# Patient Record
Sex: Male | Born: 1960 | Race: White | Hispanic: No | Marital: Married | Smoking: Former smoker
Health system: Southern US, Community
[De-identification: ages and names within clinical notes are randomized; demographics above are authoritative.]

## PROBLEM LIST (undated history)

## (undated) DIAGNOSIS — K219 Gastro-esophageal reflux disease without esophagitis: Secondary | ICD-10-CM

## (undated) DIAGNOSIS — E785 Hyperlipidemia, unspecified: Secondary | ICD-10-CM

## (undated) DIAGNOSIS — Z8489 Family history of other specified conditions: Secondary | ICD-10-CM

## (undated) DIAGNOSIS — I10 Essential (primary) hypertension: Secondary | ICD-10-CM

## (undated) DIAGNOSIS — M1990 Unspecified osteoarthritis, unspecified site: Secondary | ICD-10-CM

## (undated) DIAGNOSIS — Z87442 Personal history of urinary calculi: Secondary | ICD-10-CM

## (undated) HISTORY — PX: REPLACEMENT TOTAL KNEE: SUR1224

---

## 2001-01-07 ENCOUNTER — Encounter (INDEPENDENT_AMBULATORY_CARE_PROVIDER_SITE_OTHER): Payer: Self-pay | Admitting: Specialist

## 2001-01-07 ENCOUNTER — Other Ambulatory Visit: Admission: RE | Admit: 2001-01-07 | Discharge: 2001-01-07 | Payer: Self-pay | Admitting: Urology

## 2006-01-10 ENCOUNTER — Encounter: Admission: RE | Admit: 2006-01-10 | Discharge: 2006-01-10 | Payer: Self-pay | Admitting: Family Medicine

## 2006-03-16 ENCOUNTER — Encounter: Admission: RE | Admit: 2006-03-16 | Discharge: 2006-03-16 | Payer: Self-pay | Admitting: Family Medicine

## 2008-05-08 ENCOUNTER — Emergency Department (HOSPITAL_COMMUNITY): Admission: EM | Admit: 2008-05-08 | Discharge: 2008-05-08 | Payer: Self-pay | Admitting: Family Medicine

## 2011-03-17 ENCOUNTER — Other Ambulatory Visit: Payer: Self-pay | Admitting: Family Medicine

## 2011-03-17 DIAGNOSIS — J3489 Other specified disorders of nose and nasal sinuses: Secondary | ICD-10-CM

## 2011-03-20 ENCOUNTER — Ambulatory Visit
Admission: RE | Admit: 2011-03-20 | Discharge: 2011-03-20 | Disposition: A | Discharge status: Home / Self Care | Payer: BC Managed Care – PPO | Source: Ambulatory Visit | Attending: Family Medicine | Admitting: Family Medicine

## 2011-03-20 DIAGNOSIS — J3489 Other specified disorders of nose and nasal sinuses: Secondary | ICD-10-CM

## 2013-06-25 IMAGING — CT CT PARANASAL SINUSES LIMITED
1 series · 11 of 13 positions shown, 14 images · non-contrast
Comparison: 03/16/2006

CLINICAL DATA: Sinus pressure and drainage.  Headaches.

CT LIMITED SINUSES WITHOUT CONTRAST
TECHNIQUE: Multidetector CT images of the paranasal sinuses were
obtained in a single plane without contrast.

[Series 3: cor soft · axial · 0.38mm/px · z∈[+4,+104]mm · 11 of 13 slices shown, 14 images]
[im 2/13  brain]
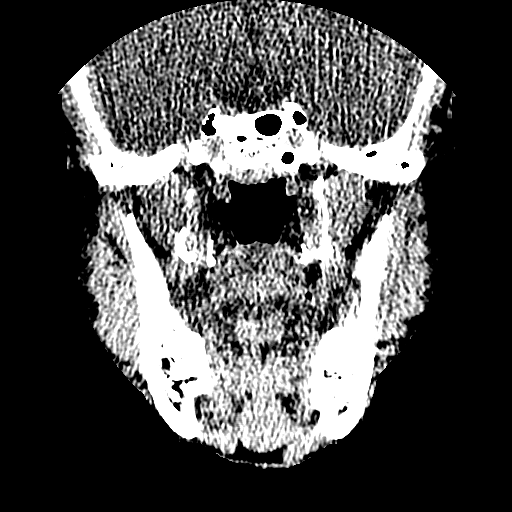
[im 2/13  bone]
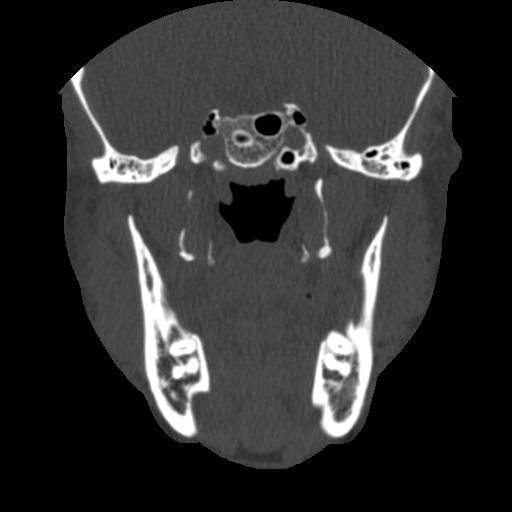
[im 3/13  bone]
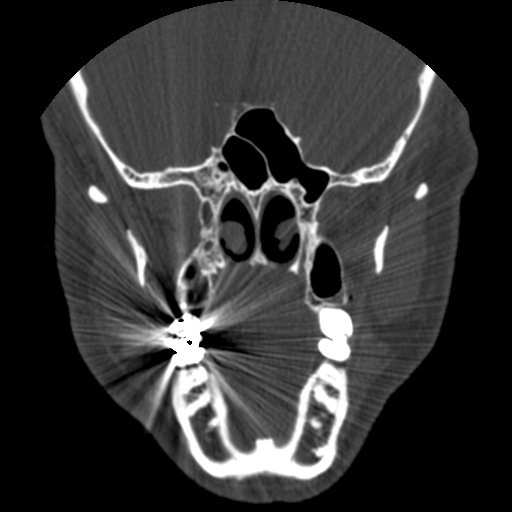
[im 4/13  bone]
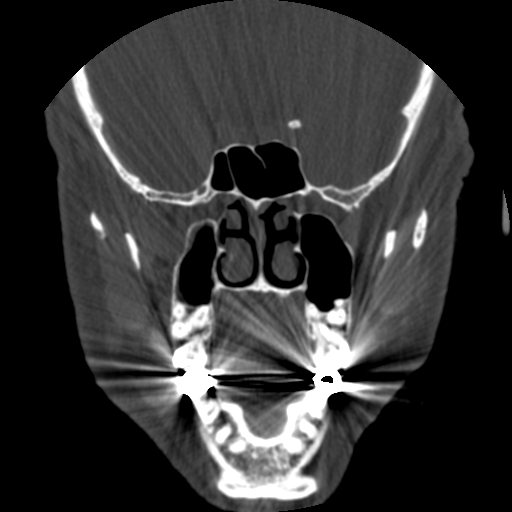
[im 5/13  bone]
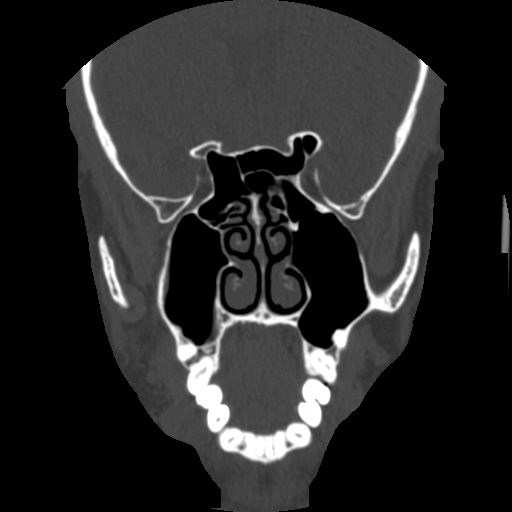
[im 6/13  brain]
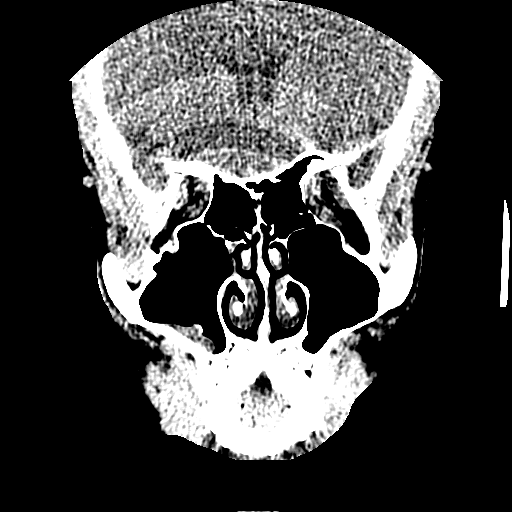
[im 6/13  bone]
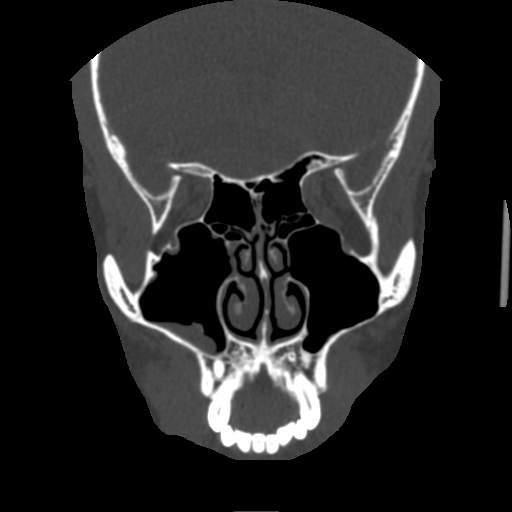
[im 7/13  bone]
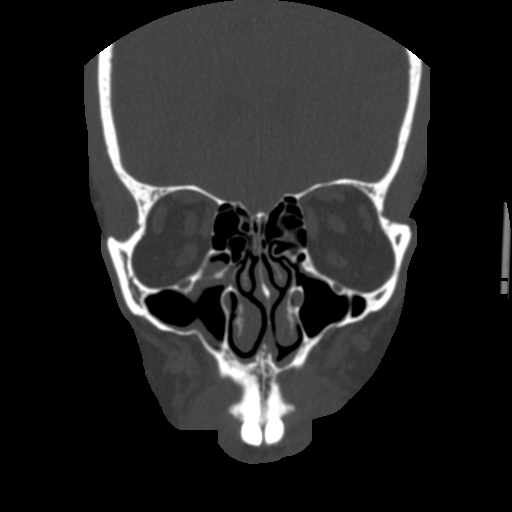
[im 8/13  bone]
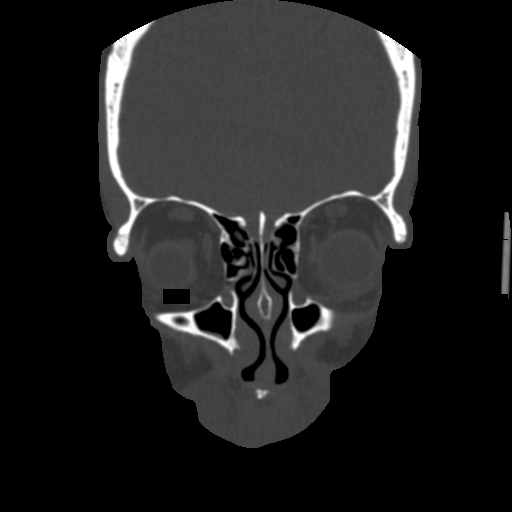
[im 9/13  bone]
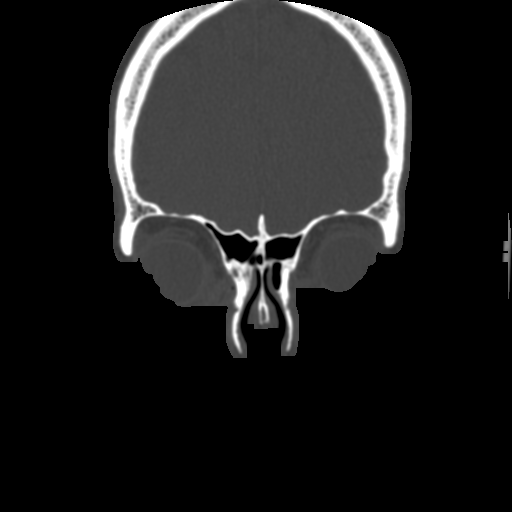
[im 10/13  brain]
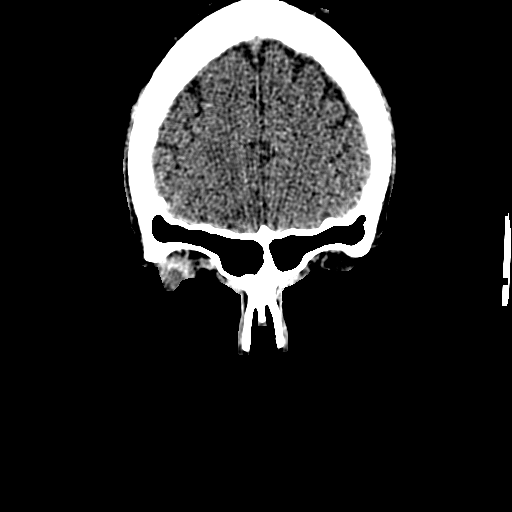
[im 10/13  bone]
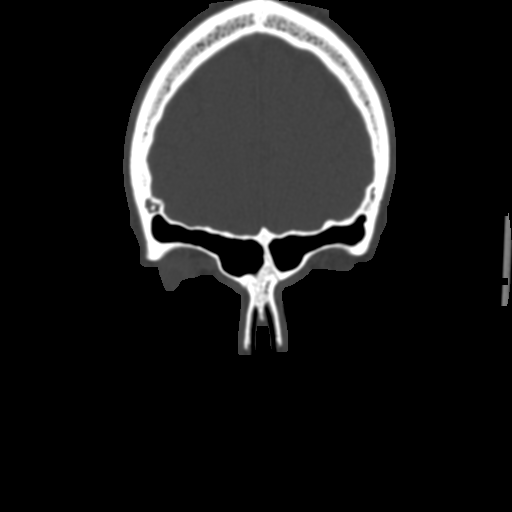
[im 11/13  bone]
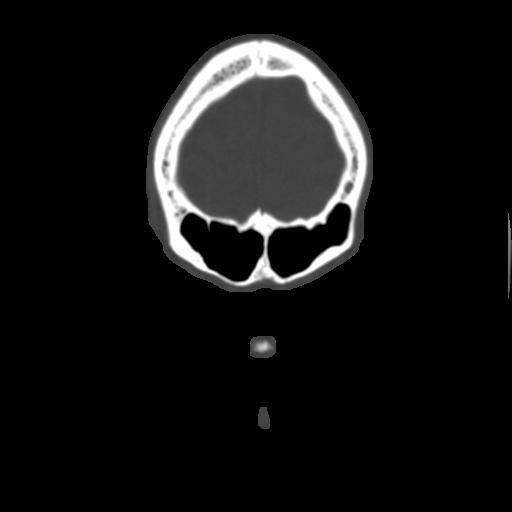
[im 12/13  bone]
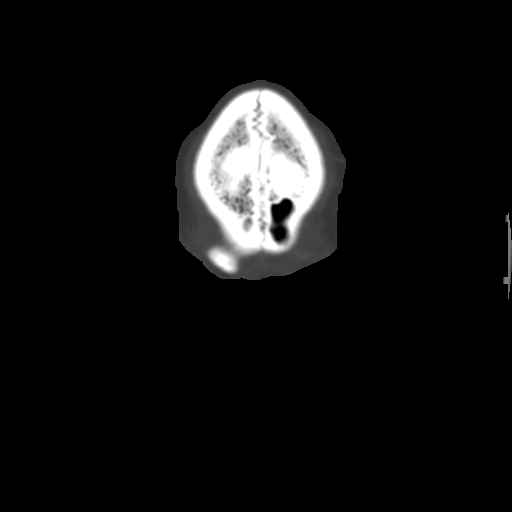

[11 of 13 positions shown; findings below may reference images not displayed]

FINDINGS: Frontal sinuses are clear.  Sphenoid sinuses are clear.
The right maxillary sinuses clear.  The left maxillary sinus shows
mild mucosal thickening but no fluid.  There are a few scattered
opacified ethmoid air cells.  Nasal septum is midline.
IMPRESSION: Scattered mucosal thickening affecting the left maxillary sinus.
No free fluid.  A few opacified ethmoid air cells.

## 2016-02-02 DIAGNOSIS — Z85828 Personal history of other malignant neoplasm of skin: Secondary | ICD-10-CM | POA: Diagnosis not present

## 2016-02-02 DIAGNOSIS — L281 Prurigo nodularis: Secondary | ICD-10-CM | POA: Diagnosis not present

## 2016-02-02 DIAGNOSIS — L565 Disseminated superficial actinic porokeratosis (DSAP): Secondary | ICD-10-CM | POA: Diagnosis not present

## 2016-02-02 DIAGNOSIS — D2262 Melanocytic nevi of left upper limb, including shoulder: Secondary | ICD-10-CM | POA: Diagnosis not present

## 2016-02-02 DIAGNOSIS — C44212 Basal cell carcinoma of skin of right ear and external auricular canal: Secondary | ICD-10-CM | POA: Diagnosis not present

## 2016-02-02 DIAGNOSIS — C44519 Basal cell carcinoma of skin of other part of trunk: Secondary | ICD-10-CM | POA: Diagnosis not present

## 2016-02-23 DIAGNOSIS — C44212 Basal cell carcinoma of skin of right ear and external auricular canal: Secondary | ICD-10-CM | POA: Diagnosis not present

## 2016-03-07 DIAGNOSIS — Z6830 Body mass index (BMI) 30.0-30.9, adult: Secondary | ICD-10-CM | POA: Diagnosis not present

## 2016-03-17 DIAGNOSIS — I10 Essential (primary) hypertension: Secondary | ICD-10-CM | POA: Diagnosis not present

## 2016-03-17 DIAGNOSIS — E782 Mixed hyperlipidemia: Secondary | ICD-10-CM | POA: Diagnosis not present

## 2016-03-17 DIAGNOSIS — E559 Vitamin D deficiency, unspecified: Secondary | ICD-10-CM | POA: Diagnosis not present

## 2016-03-17 DIAGNOSIS — R945 Abnormal results of liver function studies: Secondary | ICD-10-CM | POA: Diagnosis not present

## 2016-03-17 DIAGNOSIS — Z23 Encounter for immunization: Secondary | ICD-10-CM | POA: Diagnosis not present

## 2016-09-19 DIAGNOSIS — Z125 Encounter for screening for malignant neoplasm of prostate: Secondary | ICD-10-CM | POA: Diagnosis not present

## 2016-09-19 DIAGNOSIS — Z0000 Encounter for general adult medical examination without abnormal findings: Secondary | ICD-10-CM | POA: Diagnosis not present

## 2016-09-19 DIAGNOSIS — Z13220 Encounter for screening for lipoid disorders: Secondary | ICD-10-CM | POA: Diagnosis not present

## 2016-09-22 DIAGNOSIS — I10 Essential (primary) hypertension: Secondary | ICD-10-CM | POA: Diagnosis not present

## 2016-09-22 DIAGNOSIS — Z0000 Encounter for general adult medical examination without abnormal findings: Secondary | ICD-10-CM | POA: Diagnosis not present

## 2016-09-22 DIAGNOSIS — Z23 Encounter for immunization: Secondary | ICD-10-CM | POA: Diagnosis not present

## 2016-09-22 DIAGNOSIS — Z6833 Body mass index (BMI) 33.0-33.9, adult: Secondary | ICD-10-CM | POA: Diagnosis not present

## 2017-05-22 DIAGNOSIS — I10 Essential (primary) hypertension: Secondary | ICD-10-CM | POA: Diagnosis not present

## 2017-05-22 DIAGNOSIS — E559 Vitamin D deficiency, unspecified: Secondary | ICD-10-CM | POA: Diagnosis not present

## 2017-05-22 DIAGNOSIS — R945 Abnormal results of liver function studies: Secondary | ICD-10-CM | POA: Diagnosis not present

## 2017-05-25 ENCOUNTER — Other Ambulatory Visit: Payer: Self-pay | Admitting: Family Medicine

## 2017-05-25 DIAGNOSIS — Z23 Encounter for immunization: Secondary | ICD-10-CM | POA: Diagnosis not present

## 2017-05-25 DIAGNOSIS — I10 Essential (primary) hypertension: Secondary | ICD-10-CM | POA: Diagnosis not present

## 2017-05-25 DIAGNOSIS — R945 Abnormal results of liver function studies: Secondary | ICD-10-CM | POA: Diagnosis not present

## 2017-05-25 DIAGNOSIS — E782 Mixed hyperlipidemia: Secondary | ICD-10-CM | POA: Diagnosis not present

## 2017-05-25 DIAGNOSIS — R7989 Other specified abnormal findings of blood chemistry: Secondary | ICD-10-CM

## 2017-05-25 DIAGNOSIS — E559 Vitamin D deficiency, unspecified: Secondary | ICD-10-CM | POA: Diagnosis not present

## 2017-05-30 ENCOUNTER — Ambulatory Visit
Admission: RE | Admit: 2017-05-30 | Discharge: 2017-05-30 | Disposition: A | Discharge status: Home / Self Care | Payer: BLUE CROSS/BLUE SHIELD | Source: Ambulatory Visit | Attending: Family Medicine | Admitting: Family Medicine

## 2017-05-30 DIAGNOSIS — R7989 Other specified abnormal findings of blood chemistry: Secondary | ICD-10-CM

## 2017-05-30 DIAGNOSIS — R945 Abnormal results of liver function studies: Principal | ICD-10-CM

## 2017-06-20 DIAGNOSIS — R945 Abnormal results of liver function studies: Secondary | ICD-10-CM | POA: Diagnosis not present

## 2017-06-20 DIAGNOSIS — K8020 Calculus of gallbladder without cholecystitis without obstruction: Secondary | ICD-10-CM | POA: Diagnosis not present

## 2017-06-25 DIAGNOSIS — I10 Essential (primary) hypertension: Secondary | ICD-10-CM | POA: Diagnosis not present

## 2017-06-25 DIAGNOSIS — R945 Abnormal results of liver function studies: Secondary | ICD-10-CM | POA: Diagnosis not present

## 2017-06-25 DIAGNOSIS — N200 Calculus of kidney: Secondary | ICD-10-CM | POA: Diagnosis not present

## 2017-06-25 DIAGNOSIS — Z131 Encounter for screening for diabetes mellitus: Secondary | ICD-10-CM | POA: Diagnosis not present

## 2017-06-26 DIAGNOSIS — N200 Calculus of kidney: Secondary | ICD-10-CM | POA: Diagnosis not present

## 2017-09-25 DIAGNOSIS — Z13220 Encounter for screening for lipoid disorders: Secondary | ICD-10-CM | POA: Diagnosis not present

## 2017-09-25 DIAGNOSIS — Z114 Encounter for screening for human immunodeficiency virus [HIV]: Secondary | ICD-10-CM | POA: Diagnosis not present

## 2017-09-25 DIAGNOSIS — Z0000 Encounter for general adult medical examination without abnormal findings: Secondary | ICD-10-CM | POA: Diagnosis not present

## 2017-09-25 DIAGNOSIS — Z125 Encounter for screening for malignant neoplasm of prostate: Secondary | ICD-10-CM | POA: Diagnosis not present

## 2017-09-25 DIAGNOSIS — Z1329 Encounter for screening for other suspected endocrine disorder: Secondary | ICD-10-CM | POA: Diagnosis not present

## 2017-09-28 DIAGNOSIS — Z6833 Body mass index (BMI) 33.0-33.9, adult: Secondary | ICD-10-CM | POA: Diagnosis not present

## 2017-09-28 DIAGNOSIS — R945 Abnormal results of liver function studies: Secondary | ICD-10-CM | POA: Diagnosis not present

## 2017-09-28 DIAGNOSIS — E782 Mixed hyperlipidemia: Secondary | ICD-10-CM | POA: Diagnosis not present

## 2017-09-28 DIAGNOSIS — I10 Essential (primary) hypertension: Secondary | ICD-10-CM | POA: Diagnosis not present

## 2017-09-28 DIAGNOSIS — Z1211 Encounter for screening for malignant neoplasm of colon: Secondary | ICD-10-CM | POA: Diagnosis not present

## 2017-09-28 DIAGNOSIS — Z0000 Encounter for general adult medical examination without abnormal findings: Secondary | ICD-10-CM | POA: Diagnosis not present

## 2017-11-19 IMAGING — US US ABDOMEN COMPLETE
1 series · 13 of 25 positions shown · non-contrast
Comparison: 01/10/2006

CLINICAL DATA: Elevated LFTs

EXAM:
ABDOMEN ULTRASOUND COMPLETE

[Series 1: us abdomen complete · 0.23mm/px · 13 of 97 slices shown]
[im 1/97]
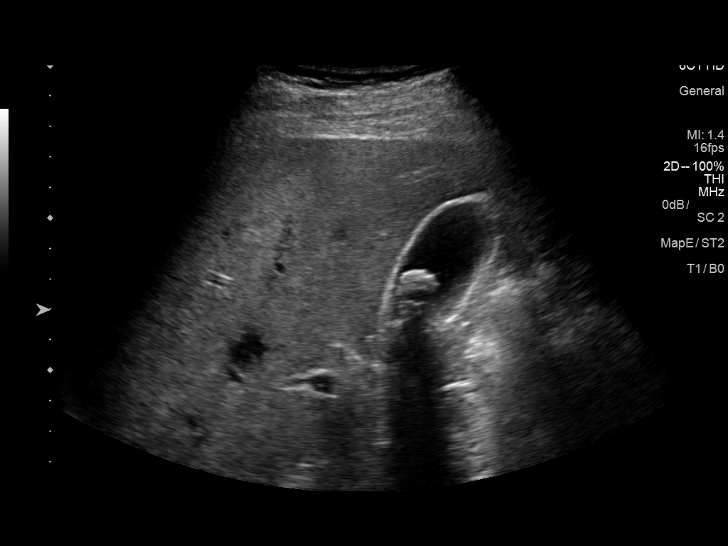
[im 9/97]
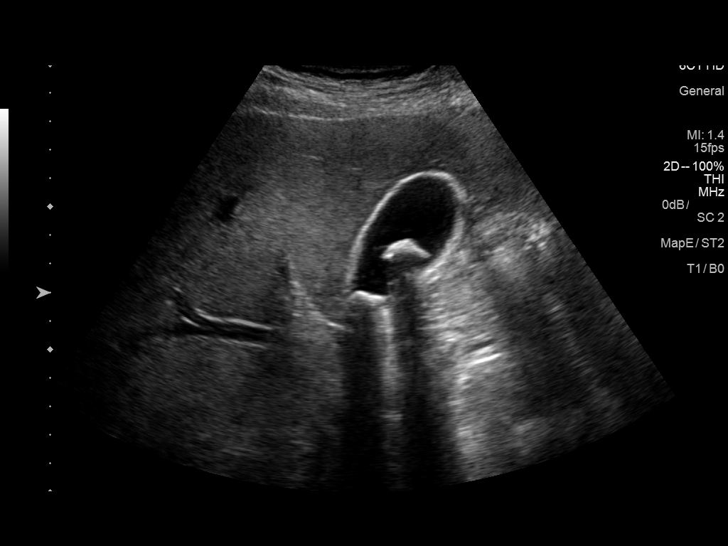
[im 17/97]
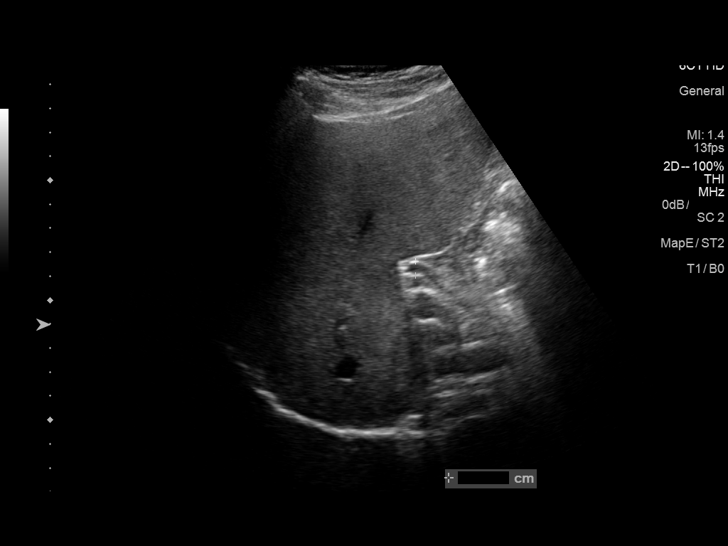
[im 25/97]
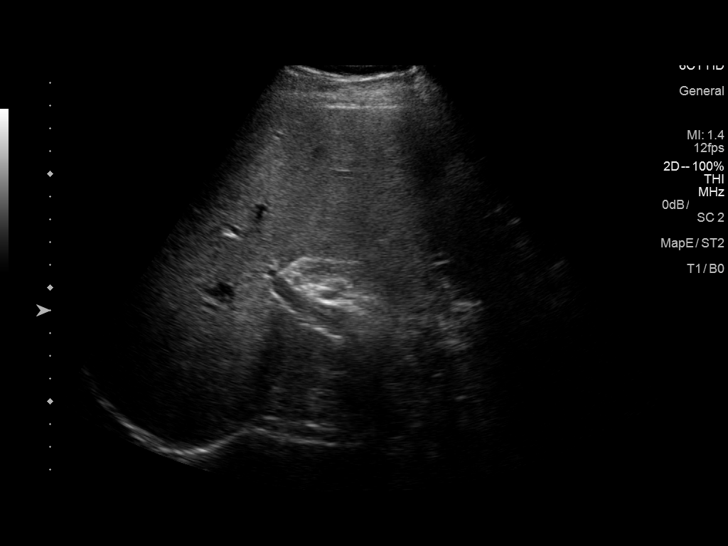
[im 33/97]
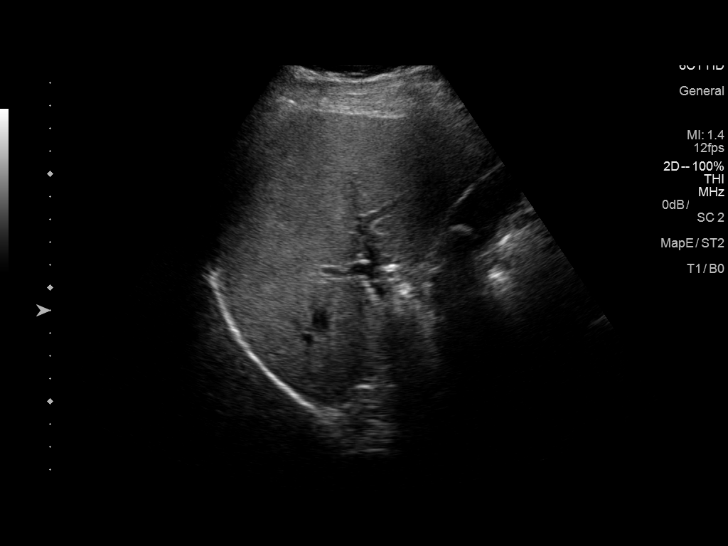
[im 41/97]
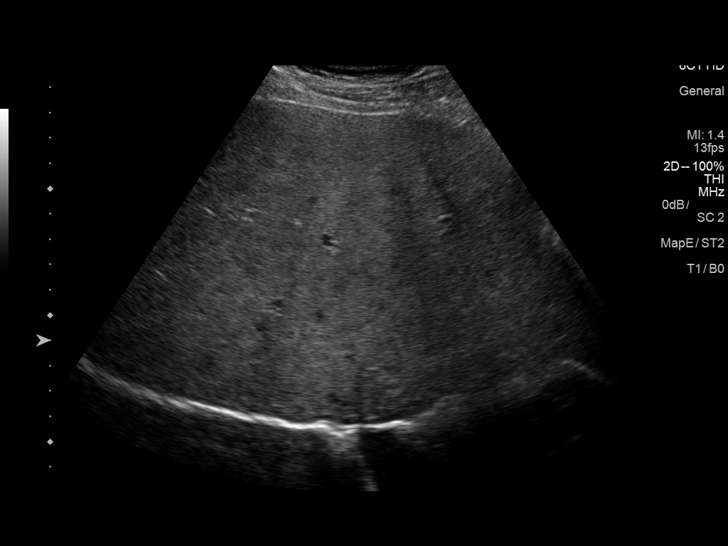
[im 49/97]
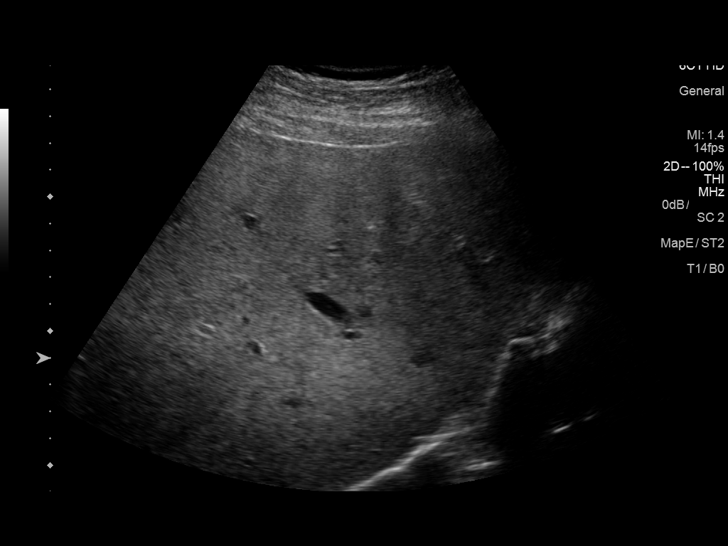
[im 57/97]
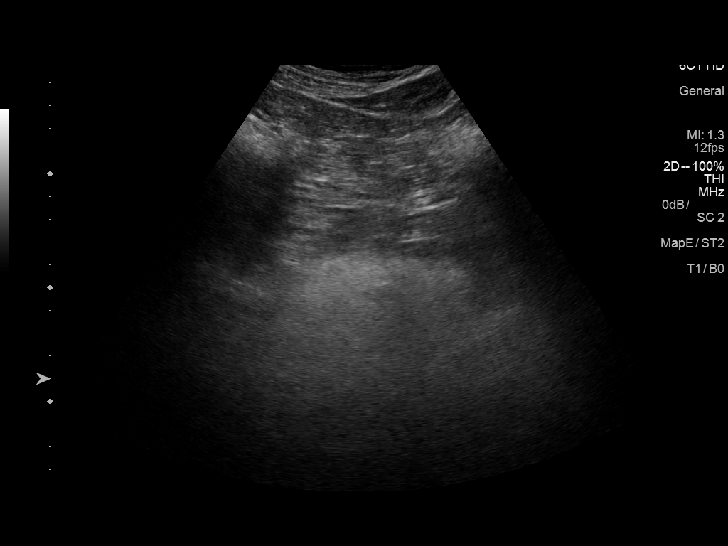
[im 65/97]
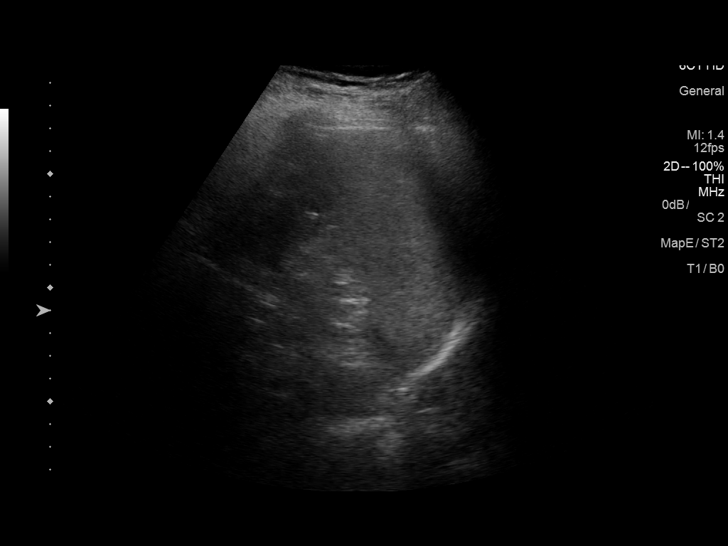
[im 73/97]
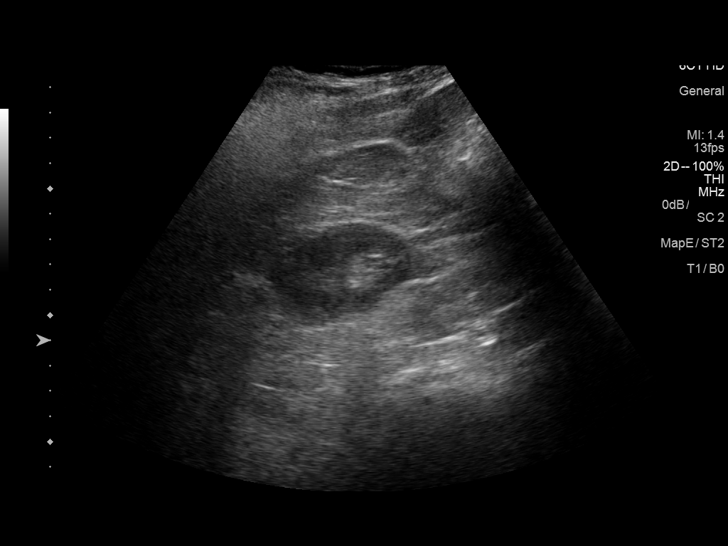
[im 81/97]
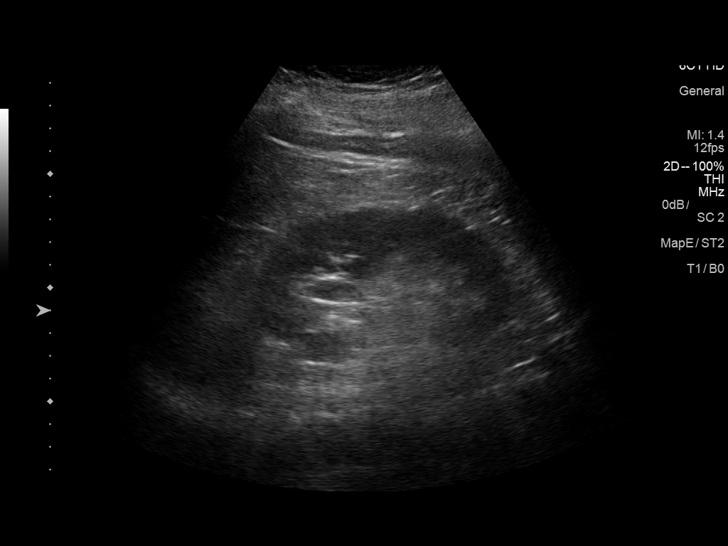
[im 89/97]
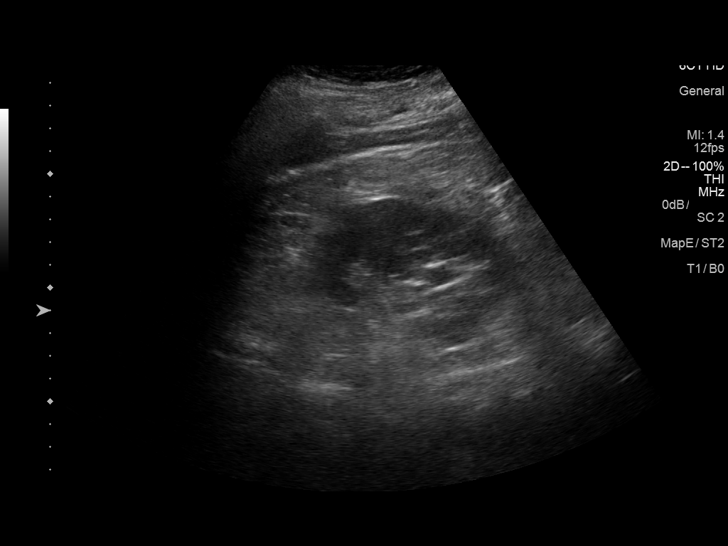
[im 97/97]
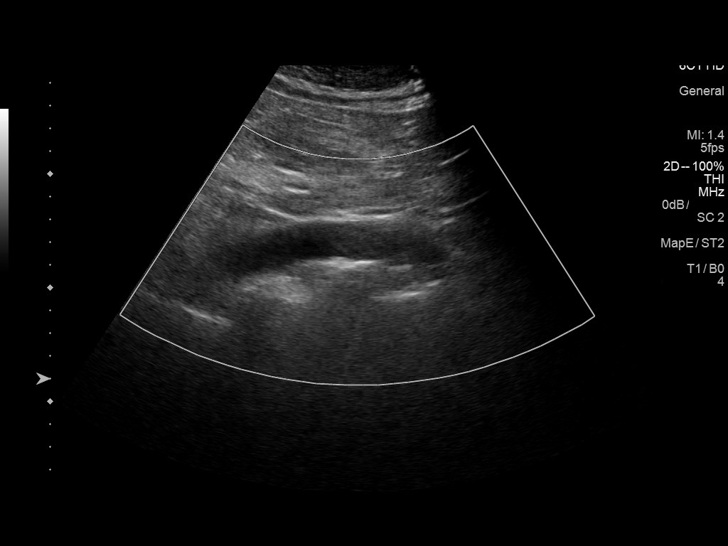

[13 of 25 positions shown; findings below may reference images not displayed]

FINDINGS: Gallbladder: Echogenic shadowing gallstones within the gallbladder.
Wall thickness measures 3.9 mm. No Murphy's sign. No pericholecystic
fluid. No signs of acute cholecystitis.

Common bile duct: Diameter: 6 mm

Liver: Mild heterogeneity, suspect slight degree of fatty
infiltration. No intrahepatic biliary dilatation. No focal
abnormality. Portal vein is patent on color Doppler imaging with
normal direction of blood flow towards the liver.

IVC: No abnormality visualized.

Pancreas: Visualized portion unremarkable.

Spleen: Size and appearance within normal limits.

Right Kidney: Length: 11.7 cm. Echogenicity within normal limits. No
mass or hydronephrosis visualized.

Left Kidney: Length: 11.4 cm. Normal echogenicity. No focal mass.
Suspect 7 mm echogenic calculus in the upper pole region with mild
associated upper pole caliectasis.

Abdominal aorta: No aneurysm visualized.

Other findings: No ascites or free fluid
IMPRESSION: Cholelithiasis without acute cholecystitis

No biliary obstruction

Suspect mild hepatic steatosis

7 mm left kidney intrarenal calculus with possible associated mild
upper pole caliectasis.

## 2017-11-30 DIAGNOSIS — Z6830 Body mass index (BMI) 30.0-30.9, adult: Secondary | ICD-10-CM | POA: Diagnosis not present

## 2017-11-30 DIAGNOSIS — J069 Acute upper respiratory infection, unspecified: Secondary | ICD-10-CM | POA: Diagnosis not present

## 2018-05-09 DIAGNOSIS — M1711 Unilateral primary osteoarthritis, right knee: Secondary | ICD-10-CM | POA: Diagnosis not present

## 2018-05-09 DIAGNOSIS — M25561 Pain in right knee: Secondary | ICD-10-CM | POA: Diagnosis not present

## 2018-07-24 DIAGNOSIS — E559 Vitamin D deficiency, unspecified: Secondary | ICD-10-CM | POA: Diagnosis not present

## 2018-07-24 DIAGNOSIS — E782 Mixed hyperlipidemia: Secondary | ICD-10-CM | POA: Diagnosis not present

## 2018-07-24 DIAGNOSIS — Z23 Encounter for immunization: Secondary | ICD-10-CM | POA: Diagnosis not present

## 2018-07-24 DIAGNOSIS — I10 Essential (primary) hypertension: Secondary | ICD-10-CM | POA: Diagnosis not present

## 2018-09-26 DIAGNOSIS — M25561 Pain in right knee: Secondary | ICD-10-CM | POA: Diagnosis not present

## 2018-09-30 DIAGNOSIS — Z13220 Encounter for screening for lipoid disorders: Secondary | ICD-10-CM | POA: Diagnosis not present

## 2018-09-30 DIAGNOSIS — E559 Vitamin D deficiency, unspecified: Secondary | ICD-10-CM | POA: Diagnosis not present

## 2018-09-30 DIAGNOSIS — Z125 Encounter for screening for malignant neoplasm of prostate: Secondary | ICD-10-CM | POA: Diagnosis not present

## 2018-09-30 DIAGNOSIS — Z0000 Encounter for general adult medical examination without abnormal findings: Secondary | ICD-10-CM | POA: Diagnosis not present

## 2018-10-04 DIAGNOSIS — Z0000 Encounter for general adult medical examination without abnormal findings: Secondary | ICD-10-CM | POA: Diagnosis not present

## 2018-10-04 DIAGNOSIS — Z6830 Body mass index (BMI) 30.0-30.9, adult: Secondary | ICD-10-CM | POA: Diagnosis not present

## 2018-10-04 DIAGNOSIS — E782 Mixed hyperlipidemia: Secondary | ICD-10-CM | POA: Diagnosis not present

## 2018-10-04 DIAGNOSIS — E559 Vitamin D deficiency, unspecified: Secondary | ICD-10-CM | POA: Diagnosis not present

## 2019-01-23 DIAGNOSIS — R945 Abnormal results of liver function studies: Secondary | ICD-10-CM | POA: Diagnosis not present

## 2019-01-23 DIAGNOSIS — I10 Essential (primary) hypertension: Secondary | ICD-10-CM | POA: Diagnosis not present

## 2019-01-23 DIAGNOSIS — E559 Vitamin D deficiency, unspecified: Secondary | ICD-10-CM | POA: Diagnosis not present

## 2019-01-23 DIAGNOSIS — E782 Mixed hyperlipidemia: Secondary | ICD-10-CM | POA: Diagnosis not present

## 2019-01-27 DIAGNOSIS — I10 Essential (primary) hypertension: Secondary | ICD-10-CM | POA: Diagnosis not present

## 2019-01-27 DIAGNOSIS — J309 Allergic rhinitis, unspecified: Secondary | ICD-10-CM | POA: Diagnosis not present

## 2019-01-27 DIAGNOSIS — E559 Vitamin D deficiency, unspecified: Secondary | ICD-10-CM | POA: Diagnosis not present

## 2019-01-27 DIAGNOSIS — E782 Mixed hyperlipidemia: Secondary | ICD-10-CM | POA: Diagnosis not present

## 2019-04-11 DIAGNOSIS — E782 Mixed hyperlipidemia: Secondary | ICD-10-CM | POA: Diagnosis not present

## 2019-04-11 DIAGNOSIS — I10 Essential (primary) hypertension: Secondary | ICD-10-CM | POA: Diagnosis not present

## 2019-04-16 DIAGNOSIS — J309 Allergic rhinitis, unspecified: Secondary | ICD-10-CM | POA: Diagnosis not present

## 2019-04-16 DIAGNOSIS — I10 Essential (primary) hypertension: Secondary | ICD-10-CM | POA: Diagnosis not present

## 2019-04-16 DIAGNOSIS — M1710 Unilateral primary osteoarthritis, unspecified knee: Secondary | ICD-10-CM | POA: Diagnosis not present

## 2019-04-16 DIAGNOSIS — E782 Mixed hyperlipidemia: Secondary | ICD-10-CM | POA: Diagnosis not present

## 2019-09-14 DIAGNOSIS — Z20828 Contact with and (suspected) exposure to other viral communicable diseases: Secondary | ICD-10-CM | POA: Diagnosis not present

## 2019-09-16 DIAGNOSIS — Z6830 Body mass index (BMI) 30.0-30.9, adult: Secondary | ICD-10-CM | POA: Diagnosis not present

## 2019-09-16 DIAGNOSIS — E663 Overweight: Secondary | ICD-10-CM | POA: Diagnosis not present

## 2019-09-16 DIAGNOSIS — I10 Essential (primary) hypertension: Secondary | ICD-10-CM | POA: Diagnosis not present

## 2019-09-16 DIAGNOSIS — U071 COVID-19: Secondary | ICD-10-CM | POA: Diagnosis not present

## 2019-09-24 DIAGNOSIS — U071 COVID-19: Secondary | ICD-10-CM | POA: Diagnosis not present

## 2019-09-24 DIAGNOSIS — J329 Chronic sinusitis, unspecified: Secondary | ICD-10-CM | POA: Diagnosis not present

## 2019-09-24 DIAGNOSIS — I10 Essential (primary) hypertension: Secondary | ICD-10-CM | POA: Diagnosis not present

## 2019-12-19 DIAGNOSIS — Z0000 Encounter for general adult medical examination without abnormal findings: Secondary | ICD-10-CM | POA: Diagnosis not present

## 2019-12-19 DIAGNOSIS — Z125 Encounter for screening for malignant neoplasm of prostate: Secondary | ICD-10-CM | POA: Diagnosis not present

## 2019-12-19 DIAGNOSIS — Z0184 Encounter for antibody response examination: Secondary | ICD-10-CM | POA: Diagnosis not present

## 2020-01-12 DIAGNOSIS — Z20828 Contact with and (suspected) exposure to other viral communicable diseases: Secondary | ICD-10-CM | POA: Diagnosis not present

## 2020-01-14 DIAGNOSIS — J309 Allergic rhinitis, unspecified: Secondary | ICD-10-CM | POA: Diagnosis not present

## 2020-01-14 DIAGNOSIS — Z0000 Encounter for general adult medical examination without abnormal findings: Secondary | ICD-10-CM | POA: Diagnosis not present

## 2020-01-14 DIAGNOSIS — Z6831 Body mass index (BMI) 31.0-31.9, adult: Secondary | ICD-10-CM | POA: Diagnosis not present

## 2020-01-14 DIAGNOSIS — Z1211 Encounter for screening for malignant neoplasm of colon: Secondary | ICD-10-CM | POA: Diagnosis not present

## 2020-01-14 DIAGNOSIS — Z23 Encounter for immunization: Secondary | ICD-10-CM | POA: Diagnosis not present

## 2020-01-23 DIAGNOSIS — H5203 Hypermetropia, bilateral: Secondary | ICD-10-CM | POA: Diagnosis not present

## 2020-01-23 DIAGNOSIS — H16223 Keratoconjunctivitis sicca, not specified as Sjogren's, bilateral: Secondary | ICD-10-CM | POA: Diagnosis not present

## 2020-01-23 DIAGNOSIS — H10413 Chronic giant papillary conjunctivitis, bilateral: Secondary | ICD-10-CM | POA: Diagnosis not present

## 2020-03-17 DIAGNOSIS — Z23 Encounter for immunization: Secondary | ICD-10-CM | POA: Diagnosis not present

## 2020-04-27 DIAGNOSIS — M25561 Pain in right knee: Secondary | ICD-10-CM | POA: Diagnosis not present

## 2020-04-27 DIAGNOSIS — M1711 Unilateral primary osteoarthritis, right knee: Secondary | ICD-10-CM | POA: Diagnosis not present

## 2020-06-09 DIAGNOSIS — E782 Mixed hyperlipidemia: Secondary | ICD-10-CM | POA: Diagnosis not present

## 2020-06-09 DIAGNOSIS — J309 Allergic rhinitis, unspecified: Secondary | ICD-10-CM | POA: Diagnosis not present

## 2020-06-09 DIAGNOSIS — Z7189 Other specified counseling: Secondary | ICD-10-CM | POA: Diagnosis not present

## 2020-06-09 DIAGNOSIS — I10 Essential (primary) hypertension: Secondary | ICD-10-CM | POA: Diagnosis not present

## 2020-06-09 DIAGNOSIS — M1990 Unspecified osteoarthritis, unspecified site: Secondary | ICD-10-CM | POA: Diagnosis not present

## 2020-06-22 DIAGNOSIS — M1711 Unilateral primary osteoarthritis, right knee: Secondary | ICD-10-CM | POA: Diagnosis not present

## 2020-06-29 DIAGNOSIS — M1711 Unilateral primary osteoarthritis, right knee: Secondary | ICD-10-CM | POA: Diagnosis not present

## 2020-07-06 DIAGNOSIS — M1711 Unilateral primary osteoarthritis, right knee: Secondary | ICD-10-CM | POA: Diagnosis not present

## 2020-10-08 DIAGNOSIS — Z7185 Encounter for immunization safety counseling: Secondary | ICD-10-CM | POA: Diagnosis not present

## 2020-10-08 DIAGNOSIS — M1711 Unilateral primary osteoarthritis, right knee: Secondary | ICD-10-CM | POA: Diagnosis not present

## 2020-10-08 DIAGNOSIS — I10 Essential (primary) hypertension: Secondary | ICD-10-CM | POA: Diagnosis not present

## 2020-10-08 DIAGNOSIS — Z720 Tobacco use: Secondary | ICD-10-CM | POA: Diagnosis not present

## 2020-10-08 DIAGNOSIS — Z8616 Personal history of COVID-19: Secondary | ICD-10-CM | POA: Diagnosis not present

## 2020-10-08 DIAGNOSIS — Z1159 Encounter for screening for other viral diseases: Secondary | ICD-10-CM | POA: Diagnosis not present

## 2020-10-08 DIAGNOSIS — F17210 Nicotine dependence, cigarettes, uncomplicated: Secondary | ICD-10-CM | POA: Diagnosis not present

## 2020-10-08 DIAGNOSIS — Z20822 Contact with and (suspected) exposure to covid-19: Secondary | ICD-10-CM | POA: Diagnosis not present

## 2020-10-08 DIAGNOSIS — E7800 Pure hypercholesterolemia, unspecified: Secondary | ICD-10-CM | POA: Diagnosis not present

## 2020-10-08 DIAGNOSIS — Z6832 Body mass index (BMI) 32.0-32.9, adult: Secondary | ICD-10-CM | POA: Diagnosis not present

## 2020-10-08 DIAGNOSIS — Z79899 Other long term (current) drug therapy: Secondary | ICD-10-CM | POA: Diagnosis not present

## 2020-10-08 DIAGNOSIS — E559 Vitamin D deficiency, unspecified: Secondary | ICD-10-CM | POA: Diagnosis not present

## 2021-01-14 DIAGNOSIS — Z0000 Encounter for general adult medical examination without abnormal findings: Secondary | ICD-10-CM | POA: Diagnosis not present

## 2021-01-14 DIAGNOSIS — E669 Obesity, unspecified: Secondary | ICD-10-CM | POA: Diagnosis not present

## 2021-01-14 DIAGNOSIS — E559 Vitamin D deficiency, unspecified: Secondary | ICD-10-CM | POA: Diagnosis not present

## 2021-01-14 DIAGNOSIS — I10 Essential (primary) hypertension: Secondary | ICD-10-CM | POA: Diagnosis not present

## 2021-01-14 DIAGNOSIS — Z79899 Other long term (current) drug therapy: Secondary | ICD-10-CM | POA: Diagnosis not present

## 2021-01-14 DIAGNOSIS — Z6831 Body mass index (BMI) 31.0-31.9, adult: Secondary | ICD-10-CM | POA: Diagnosis not present

## 2021-01-14 DIAGNOSIS — Z720 Tobacco use: Secondary | ICD-10-CM | POA: Diagnosis not present

## 2021-01-14 DIAGNOSIS — F17210 Nicotine dependence, cigarettes, uncomplicated: Secondary | ICD-10-CM | POA: Diagnosis not present

## 2021-01-14 DIAGNOSIS — E7800 Pure hypercholesterolemia, unspecified: Secondary | ICD-10-CM | POA: Diagnosis not present

## 2021-01-14 DIAGNOSIS — Z125 Encounter for screening for malignant neoplasm of prostate: Secondary | ICD-10-CM | POA: Diagnosis not present

## 2021-01-14 DIAGNOSIS — R0602 Shortness of breath: Secondary | ICD-10-CM | POA: Diagnosis not present

## 2021-01-14 DIAGNOSIS — Z1331 Encounter for screening for depression: Secondary | ICD-10-CM | POA: Diagnosis not present

## 2021-01-14 DIAGNOSIS — Z7185 Encounter for immunization safety counseling: Secondary | ICD-10-CM | POA: Diagnosis not present

## 2021-01-14 DIAGNOSIS — Z8616 Personal history of COVID-19: Secondary | ICD-10-CM | POA: Diagnosis not present

## 2021-01-14 DIAGNOSIS — Z131 Encounter for screening for diabetes mellitus: Secondary | ICD-10-CM | POA: Diagnosis not present

## 2021-01-14 DIAGNOSIS — Z1159 Encounter for screening for other viral diseases: Secondary | ICD-10-CM | POA: Diagnosis not present

## 2021-01-14 DIAGNOSIS — Z1339 Encounter for screening examination for other mental health and behavioral disorders: Secondary | ICD-10-CM | POA: Diagnosis not present

## 2021-02-28 DIAGNOSIS — E7800 Pure hypercholesterolemia, unspecified: Secondary | ICD-10-CM | POA: Diagnosis not present

## 2021-02-28 DIAGNOSIS — Z6831 Body mass index (BMI) 31.0-31.9, adult: Secondary | ICD-10-CM | POA: Diagnosis not present

## 2021-02-28 DIAGNOSIS — I10 Essential (primary) hypertension: Secondary | ICD-10-CM | POA: Diagnosis not present

## 2021-02-28 DIAGNOSIS — E669 Obesity, unspecified: Secondary | ICD-10-CM | POA: Diagnosis not present

## 2021-02-28 DIAGNOSIS — U071 COVID-19: Secondary | ICD-10-CM | POA: Diagnosis not present

## 2021-03-03 DIAGNOSIS — F17210 Nicotine dependence, cigarettes, uncomplicated: Secondary | ICD-10-CM | POA: Diagnosis not present

## 2021-03-03 DIAGNOSIS — E669 Obesity, unspecified: Secondary | ICD-10-CM | POA: Diagnosis not present

## 2021-03-03 DIAGNOSIS — U071 COVID-19: Secondary | ICD-10-CM | POA: Diagnosis not present

## 2021-03-03 DIAGNOSIS — Z7185 Encounter for immunization safety counseling: Secondary | ICD-10-CM | POA: Diagnosis not present

## 2021-03-03 DIAGNOSIS — Z720 Tobacco use: Secondary | ICD-10-CM | POA: Diagnosis not present

## 2021-03-03 DIAGNOSIS — I10 Essential (primary) hypertension: Secondary | ICD-10-CM | POA: Diagnosis not present

## 2021-03-03 DIAGNOSIS — E7800 Pure hypercholesterolemia, unspecified: Secondary | ICD-10-CM | POA: Diagnosis not present

## 2021-03-03 DIAGNOSIS — Z6831 Body mass index (BMI) 31.0-31.9, adult: Secondary | ICD-10-CM | POA: Diagnosis not present

## 2021-04-21 DIAGNOSIS — N200 Calculus of kidney: Secondary | ICD-10-CM | POA: Diagnosis not present

## 2021-05-04 DIAGNOSIS — F17210 Nicotine dependence, cigarettes, uncomplicated: Secondary | ICD-10-CM | POA: Diagnosis not present

## 2021-05-04 DIAGNOSIS — I10 Essential (primary) hypertension: Secondary | ICD-10-CM | POA: Diagnosis not present

## 2021-05-04 DIAGNOSIS — R0981 Nasal congestion: Secondary | ICD-10-CM | POA: Diagnosis not present

## 2021-05-04 DIAGNOSIS — E669 Obesity, unspecified: Secondary | ICD-10-CM | POA: Diagnosis not present

## 2021-05-04 DIAGNOSIS — Z79899 Other long term (current) drug therapy: Secondary | ICD-10-CM | POA: Diagnosis not present

## 2021-05-04 DIAGNOSIS — Z7185 Encounter for immunization safety counseling: Secondary | ICD-10-CM | POA: Diagnosis not present

## 2021-05-04 DIAGNOSIS — E559 Vitamin D deficiency, unspecified: Secondary | ICD-10-CM | POA: Diagnosis not present

## 2021-05-04 DIAGNOSIS — Z6831 Body mass index (BMI) 31.0-31.9, adult: Secondary | ICD-10-CM | POA: Diagnosis not present

## 2021-05-04 DIAGNOSIS — Z8616 Personal history of COVID-19: Secondary | ICD-10-CM | POA: Diagnosis not present

## 2021-05-04 DIAGNOSIS — E7800 Pure hypercholesterolemia, unspecified: Secondary | ICD-10-CM | POA: Diagnosis not present

## 2021-05-04 DIAGNOSIS — Z720 Tobacco use: Secondary | ICD-10-CM | POA: Diagnosis not present

## 2021-05-09 DIAGNOSIS — R3129 Other microscopic hematuria: Secondary | ICD-10-CM | POA: Diagnosis not present

## 2021-05-24 DIAGNOSIS — L570 Actinic keratosis: Secondary | ICD-10-CM | POA: Diagnosis not present

## 2021-05-24 DIAGNOSIS — Z1283 Encounter for screening for malignant neoplasm of skin: Secondary | ICD-10-CM | POA: Diagnosis not present

## 2021-07-14 DIAGNOSIS — M25561 Pain in right knee: Secondary | ICD-10-CM | POA: Diagnosis not present

## 2021-08-17 DIAGNOSIS — M25561 Pain in right knee: Secondary | ICD-10-CM | POA: Diagnosis not present

## 2021-08-18 DIAGNOSIS — F17210 Nicotine dependence, cigarettes, uncomplicated: Secondary | ICD-10-CM | POA: Diagnosis not present

## 2021-08-18 DIAGNOSIS — Z7185 Encounter for immunization safety counseling: Secondary | ICD-10-CM | POA: Diagnosis not present

## 2021-08-18 DIAGNOSIS — Z720 Tobacco use: Secondary | ICD-10-CM | POA: Diagnosis not present

## 2021-08-18 DIAGNOSIS — I10 Essential (primary) hypertension: Secondary | ICD-10-CM | POA: Diagnosis not present

## 2021-08-18 DIAGNOSIS — Z8616 Personal history of COVID-19: Secondary | ICD-10-CM | POA: Diagnosis not present

## 2021-08-18 DIAGNOSIS — E7800 Pure hypercholesterolemia, unspecified: Secondary | ICD-10-CM | POA: Diagnosis not present

## 2021-08-18 DIAGNOSIS — Z6831 Body mass index (BMI) 31.0-31.9, adult: Secondary | ICD-10-CM | POA: Diagnosis not present

## 2021-08-18 DIAGNOSIS — E669 Obesity, unspecified: Secondary | ICD-10-CM | POA: Diagnosis not present

## 2021-08-25 DIAGNOSIS — Z6831 Body mass index (BMI) 31.0-31.9, adult: Secondary | ICD-10-CM | POA: Diagnosis not present

## 2021-08-25 DIAGNOSIS — Z20822 Contact with and (suspected) exposure to covid-19: Secondary | ICD-10-CM | POA: Diagnosis not present

## 2021-08-25 DIAGNOSIS — E559 Vitamin D deficiency, unspecified: Secondary | ICD-10-CM | POA: Diagnosis not present

## 2021-08-25 DIAGNOSIS — Z79899 Other long term (current) drug therapy: Secondary | ICD-10-CM | POA: Diagnosis not present

## 2021-08-25 DIAGNOSIS — E7800 Pure hypercholesterolemia, unspecified: Secondary | ICD-10-CM | POA: Diagnosis not present

## 2021-08-29 DIAGNOSIS — M1711 Unilateral primary osteoarthritis, right knee: Secondary | ICD-10-CM | POA: Diagnosis not present

## 2021-08-31 DIAGNOSIS — Z96651 Presence of right artificial knee joint: Secondary | ICD-10-CM | POA: Diagnosis not present

## 2021-08-31 DIAGNOSIS — M25561 Pain in right knee: Secondary | ICD-10-CM | POA: Diagnosis not present

## 2021-09-05 DIAGNOSIS — Z96659 Presence of unspecified artificial knee joint: Secondary | ICD-10-CM | POA: Diagnosis not present

## 2021-09-05 DIAGNOSIS — M25561 Pain in right knee: Secondary | ICD-10-CM | POA: Diagnosis not present

## 2021-09-07 DIAGNOSIS — Z96659 Presence of unspecified artificial knee joint: Secondary | ICD-10-CM | POA: Diagnosis not present

## 2021-09-07 DIAGNOSIS — M25561 Pain in right knee: Secondary | ICD-10-CM | POA: Diagnosis not present

## 2021-09-09 DIAGNOSIS — Z4789 Encounter for other orthopedic aftercare: Secondary | ICD-10-CM | POA: Diagnosis not present

## 2021-11-22 ENCOUNTER — Emergency Department
Admission: EM | Admit: 2021-11-22 | Discharge: 2021-11-23 | Disposition: A | Discharge status: Home / Self Care | Payer: BC Managed Care – PPO | Attending: Emergency Medicine | Admitting: Emergency Medicine

## 2021-11-22 ENCOUNTER — Other Ambulatory Visit: Payer: Self-pay

## 2021-11-22 ENCOUNTER — Encounter: Payer: Self-pay | Admitting: Emergency Medicine

## 2021-11-22 ENCOUNTER — Emergency Department: Payer: BC Managed Care – PPO

## 2021-11-22 DIAGNOSIS — R079 Chest pain, unspecified: Secondary | ICD-10-CM | POA: Diagnosis not present

## 2021-11-22 DIAGNOSIS — I10 Essential (primary) hypertension: Secondary | ICD-10-CM | POA: Insufficient documentation

## 2021-11-22 DIAGNOSIS — M25512 Pain in left shoulder: Secondary | ICD-10-CM | POA: Insufficient documentation

## 2021-11-22 HISTORY — DX: Hyperlipidemia, unspecified: E78.5

## 2021-11-22 HISTORY — DX: Essential (primary) hypertension: I10

## 2021-11-22 LAB — BASIC METABOLIC PANEL
Anion gap: 6 (ref 5–15)
BUN: 15 mg/dL (ref 6–20)
CO2: 29 mmol/L (ref 22–32)
Calcium: 9.1 mg/dL (ref 8.9–10.3)
Chloride: 107 mmol/L (ref 98–111)
Creatinine, Ser: 1 mg/dL (ref 0.61–1.24)
GFR, Estimated: >60 mL/min (ref >60)
Glucose, Bld: 167 mg/dL — ABNORMAL HIGH (ref 70–99)
Potassium: 4.1 mmol/L (ref 3.5–5.1)
Sodium: 142 mmol/L (ref 135–145)

## 2021-11-22 LAB — CBC
HCT: 44 % (ref 39.0–52.0)
Hemoglobin: 14 g/dL (ref 13.0–17.0)
MCH: 27.9 pg (ref 26.0–34.0)
MCHC: 31.8 g/dL (ref 30.0–36.0)
MCV: 87.8 fL (ref 80.0–100.0)
Platelets: 299 10*3/uL (ref 150–400)
RBC: 5.01 MIL/uL (ref 4.22–5.81)
RDW: 13.2 % (ref 11.5–15.5)
WBC: 8.8 10*3/uL (ref 4.0–10.5)
nRBC: 0 % (ref 0.0–0.2)

## 2021-11-22 LAB — TROPONIN I (HIGH SENSITIVITY): Troponin I (High Sensitivity): 6 ng/L (ref <18)

## 2021-11-22 NOTE — ED Provider Notes (Signed)
? ?[facility] ?Provider Note ? ? ? Event Date/Time  ? First MD Initiated Contact with Patient 11/22/21 2255   ?  (approximate) ? ? ?History  ? ?Chest Pain ? ? ?HPI ? ?Eddie Rios is a 61 y.o. male with past medical history of hypertension hyperlipidemia presents with chest pain.  Started around 745.  Patient was sitting down watching TV.  For started having pain around the left shoulder blade that then radiated around to his left chest.  Described as sharp.  It was constant for about an hour or 2.  Was not worse with exertion or inspiration he denies any associated symptoms including no nausea vomiting diaphoresis shortness of breath lightheadedness dizziness.  No history of similar symptoms.  Denies any history of cardiac disease.  He is not aware that he is never diagnosed with hypertension and does take medication for his lipids.  Denies shortness of breath fevers chills no abdominal pain.  Currently he is pain-free. ?  ? ?Past Medical History:  ?Diagnosis Date  ? Hyperlipidemia   ? Hypertension   ? ? ?There are no problems to display for this patient. ? ? ? ?Physical Exam  ?Triage Vital Signs: ?ED Triage Vitals  ?Enc Vitals Group  ?   BP 11/22/21 2050 (!) 207/120  ?   Pulse Rate 11/22/21 2050 83  ?   Resp 11/22/21 2050 16  ?   Temp 11/22/21 2050 98.3 ?F (36.8 ?C)  ?   Temp Source 11/22/21 2050 Oral  ?   SpO2 11/22/21 2050 99 %  ?   Weight 11/22/21 2045 235 lb (106.6 kg)  ?   Height 11/22/21 2045 6' (1.829 m)  ?   Head Circumference --   ?   Peak Flow --   ?   Pain Score 11/22/21 2045 5  ?   Pain Loc --   ?   Pain Edu? --   ?   Excl. in GC? --   ? ? ?Most recent vital signs: ?Vitals:  ? 11/23/21 0000 11/23/21 0015  ?BP: (!) 156/96   ?Pulse: 70 70  ?Resp: (!) 21 19  ?Temp:    ?SpO2: 97% 95%  ? ? ? ?General: Awake, no distress.  ?CV:  Good peripheral perfusion.  No lower extremity edema ?Resp:  Normal effort.  ?Abd:  No distention.  Abdomen is soft nontender throughout ?Neuro:              Awake, Alert, Oriented x 3  ?Other:   ? ? ?ED Results / Procedures / Treatments  ?Labs ?(all labs ordered are listed, but only abnormal results are displayed) ?Labs Reviewed  ?BASIC METABOLIC PANEL - Abnormal; Notable for the following components:  ?    Result Value  ? Glucose, Bld 167 (*)   ? All other components within normal limits  ?CBC  ?TROPONIN I (HIGH SENSITIVITY)  ?TROPONIN I (HIGH SENSITIVITY)  ? ? ? ?EKG ? ?Reviewed by myself, left axis deviation, left anterior fascicular block, incomplete right bundle branch block, normal sinus rhythm no acute ischemic changes ? ?RADIOLOGY ?I reviewed the CXR which does not show any acute cardiopulmonary process; agree with radiology report  ? ? ? ?PROCEDURES: ? ?Critical Care performed: No ? ?.1-3 Lead EKG Interpretation ?Performed by: Georga Hacking, MD ?Authorized by: Georga Hacking, MD  ? ?  Interpretation: normal   ?  ECG rate assessment: normal   ?  Ectopy: none   ?  Conduction:  normal   ? ?The patient is on the cardiac monitor to evaluate for evidence of arrhythmia and/or significant heart rate changes. ? ? ?MEDICATIONS ORDERED IN ED: ?Medications - No data to display ? ? ?IMPRESSION / MDM / ASSESSMENT AND PLAN / ED COURSE  ?I reviewed the triage vital signs and the nursing notes. ?             ?               ? ?Differential diagnosis includes, but is not limited to, ACS, musculoskeletal, reflux, biliary colic, less likely aortic dissection, PE ? ?Patient is a 61 year old male with hyperlipidemia who presents with chest pain.  Started about 745 while at rest.  Described as sharp initially involving the back and then left side of his chest.  There is no associated symptoms it was not exertional.  Had symptoms for about 2 hours and have since resolved since being in the ED.  Actually felt that walking around made his symptoms feel better.  His EKG shows a incomplete right bundle and left anterior fascicular block but there are no acute ischemic changes.   Initial troponin is negative.  Will need a second troponin given early onset of his symptoms.  ACS certainly possible and he does have some risk factors.  If 2 negative troponins and no EKG changes with ongoing symptom free I think he will be appropriate for outpatient discharge.  He is hypertensive here, discussed monitoring his blood pressure at home and following up with his PCP.  Did consider aortic dissection however given he is pain-free this is less likely.   ? ?Patient's repeat EKG is nonischemic and his repeat troponin is negative.  Continues to be pain-free.  I think at this point he is appropriate for outpatient follow-up with cardiology and primary care.  We discussed return precautions. ? ?Clinical Course as of 11/23/21 0046  ?Tue Nov 22, 2021  ?2327 RBC: 5.01 [KM]  ?Wed Nov 23, 2021  ?0011 Troponin I (High Sensitivity): 6 [KM]  ?  ?Clinical Course User Index ?[KM] Georga Hacking, MD  ? ? ? ?FINAL CLINICAL IMPRESSION(S) / ED DIAGNOSES  ? ?Final diagnoses:  ?Chest pain, unspecified type  ? ? ? ?Rx / DC Orders  ? ?ED Discharge Orders   ? ? None  ? ?  ? ? ? ?Note:  This document was prepared using Dragon voice recognition software and may include unintentional dictation errors. ?  ?Georga Hacking, MD ?11/23/21 [phone] ? ?

## 2021-11-22 NOTE — ED Triage Notes (Signed)
Pt to ED from home c/o mid left chest pain started suddenly 30 min PTA, states started in his back.  Denies n/v/d or SOB.  Denies cardiac hx or similar pain.  Pain is sharp, achy, heavy.  Pt A&Ox4, chest rise even and unlabored, skin WNL and in NAD at this time. ?

## 2021-11-22 NOTE — ED Notes (Signed)
Pt reports chest pain and pressure tonight.  No sob.  Blood pressure elevated.  Pt took bp meds today.  No n/v  no diaphoresis.  No back pain.  Pt alert  speech clear.  Family with pt.   ?

## 2021-11-23 ENCOUNTER — Other Ambulatory Visit: Payer: Self-pay

## 2021-11-23 LAB — TROPONIN I (HIGH SENSITIVITY): Troponin I (High Sensitivity): 6 ng/L (ref <18)

## 2021-11-23 NOTE — ED Notes (Signed)
Pt signed esignature.  D/c inst to pt.  Iv dc'ed.   

## 2021-11-23 NOTE — Discharge Instructions (Addendum)
Your work-up today including your cardiac enzymes EKG and chest x-ray were all reassuring.  Please follow-up with your primary care provider for management of your blood pressure.  If you have any ongoing pain please return to the emergency department.  Please follow-up with cardiology as well. ?

## 2024-02-28 IMAGING — CR DG CHEST 2V
1 series · 3 of 3 positions shown · non-contrast
Comparison: None.

CLINICAL DATA: Chest pain. Mid to left-sided chest pain, onset
today.

EXAM:
CHEST - 2 VIEW

[Series 1: dg chest 2 view · 0.14mm/px · 3 of 3 slices shown]
[im 1/3]
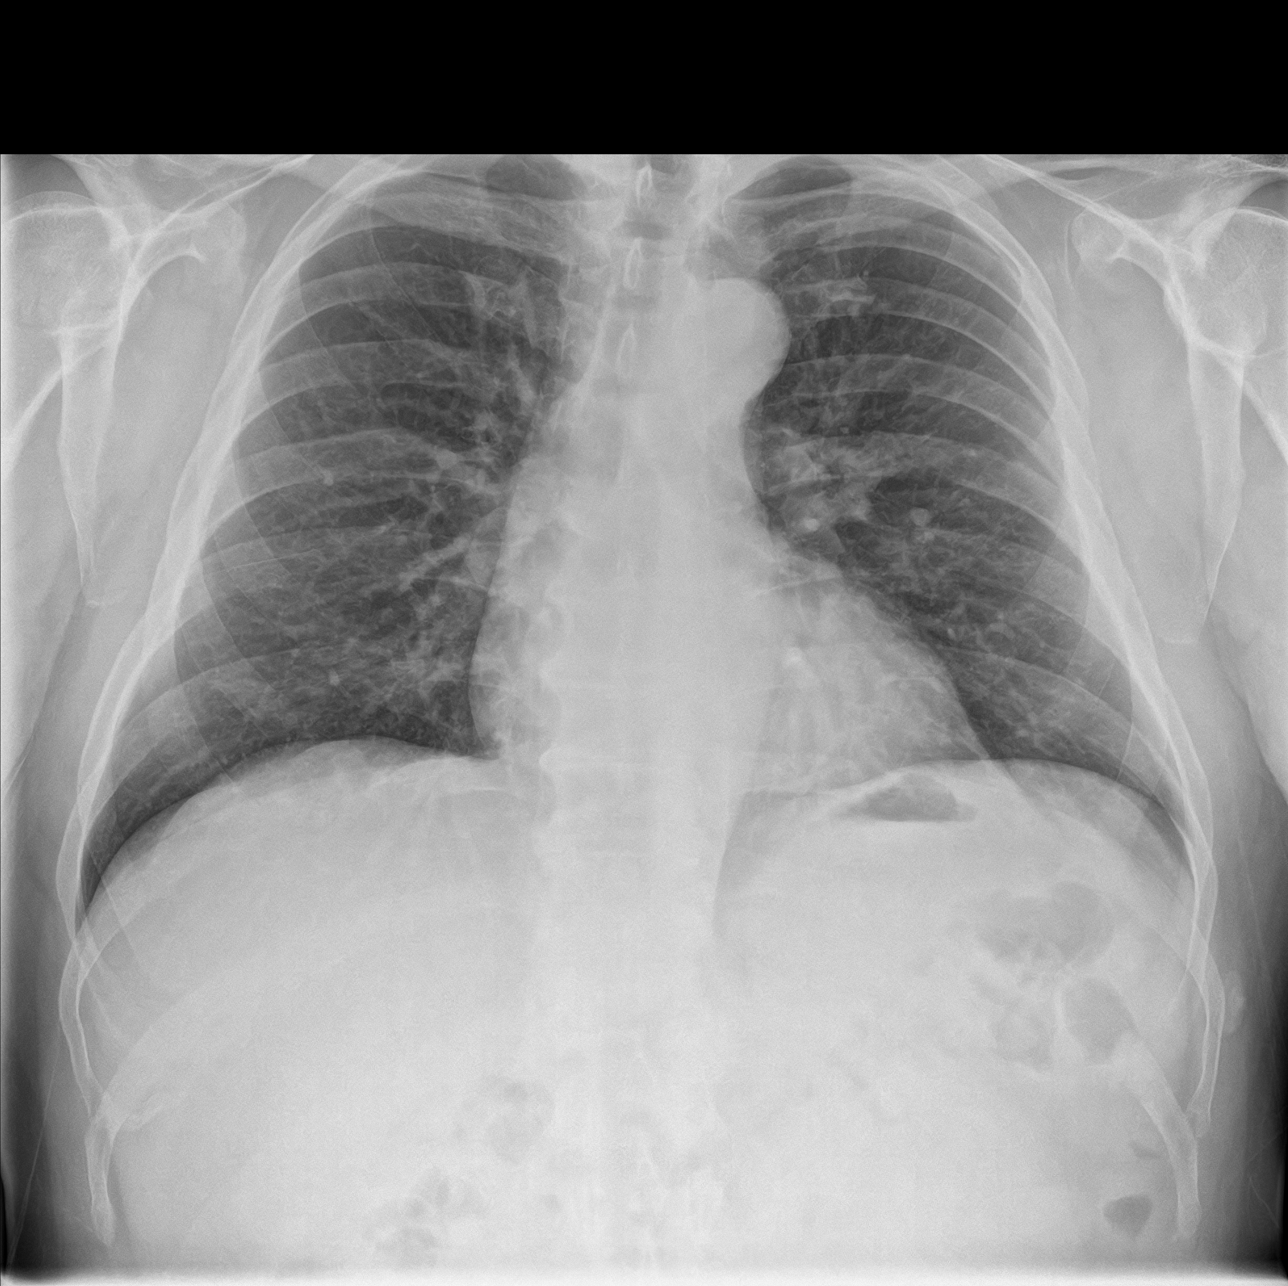
[im 2/3]
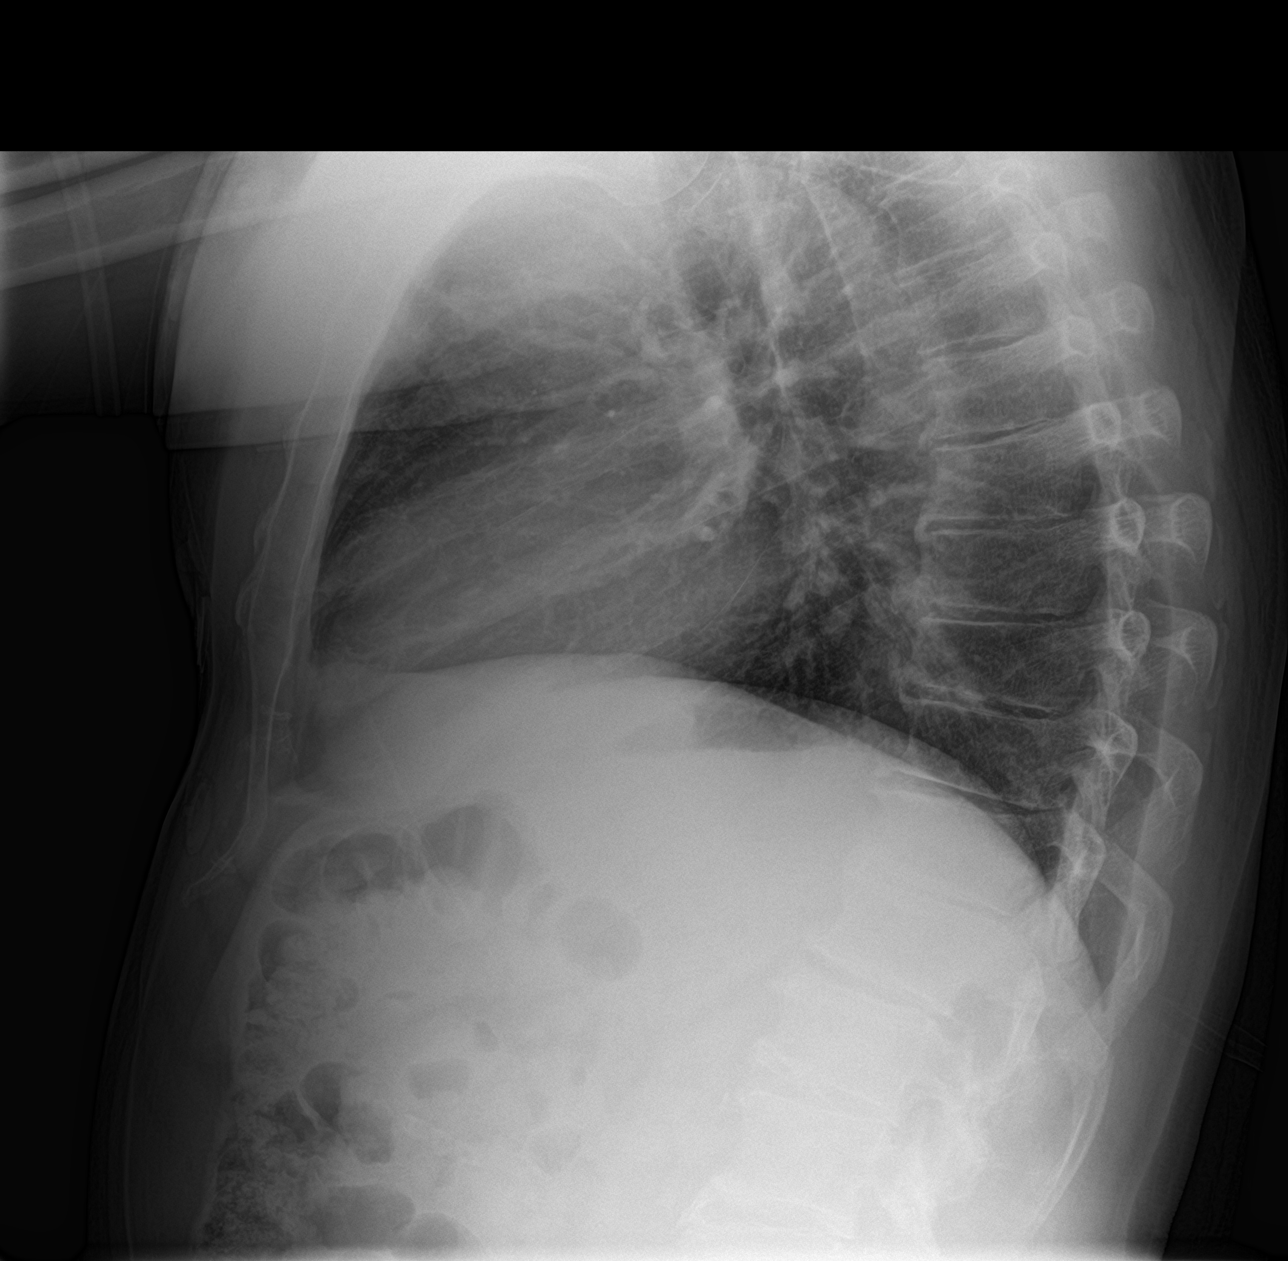
[im 3/3]
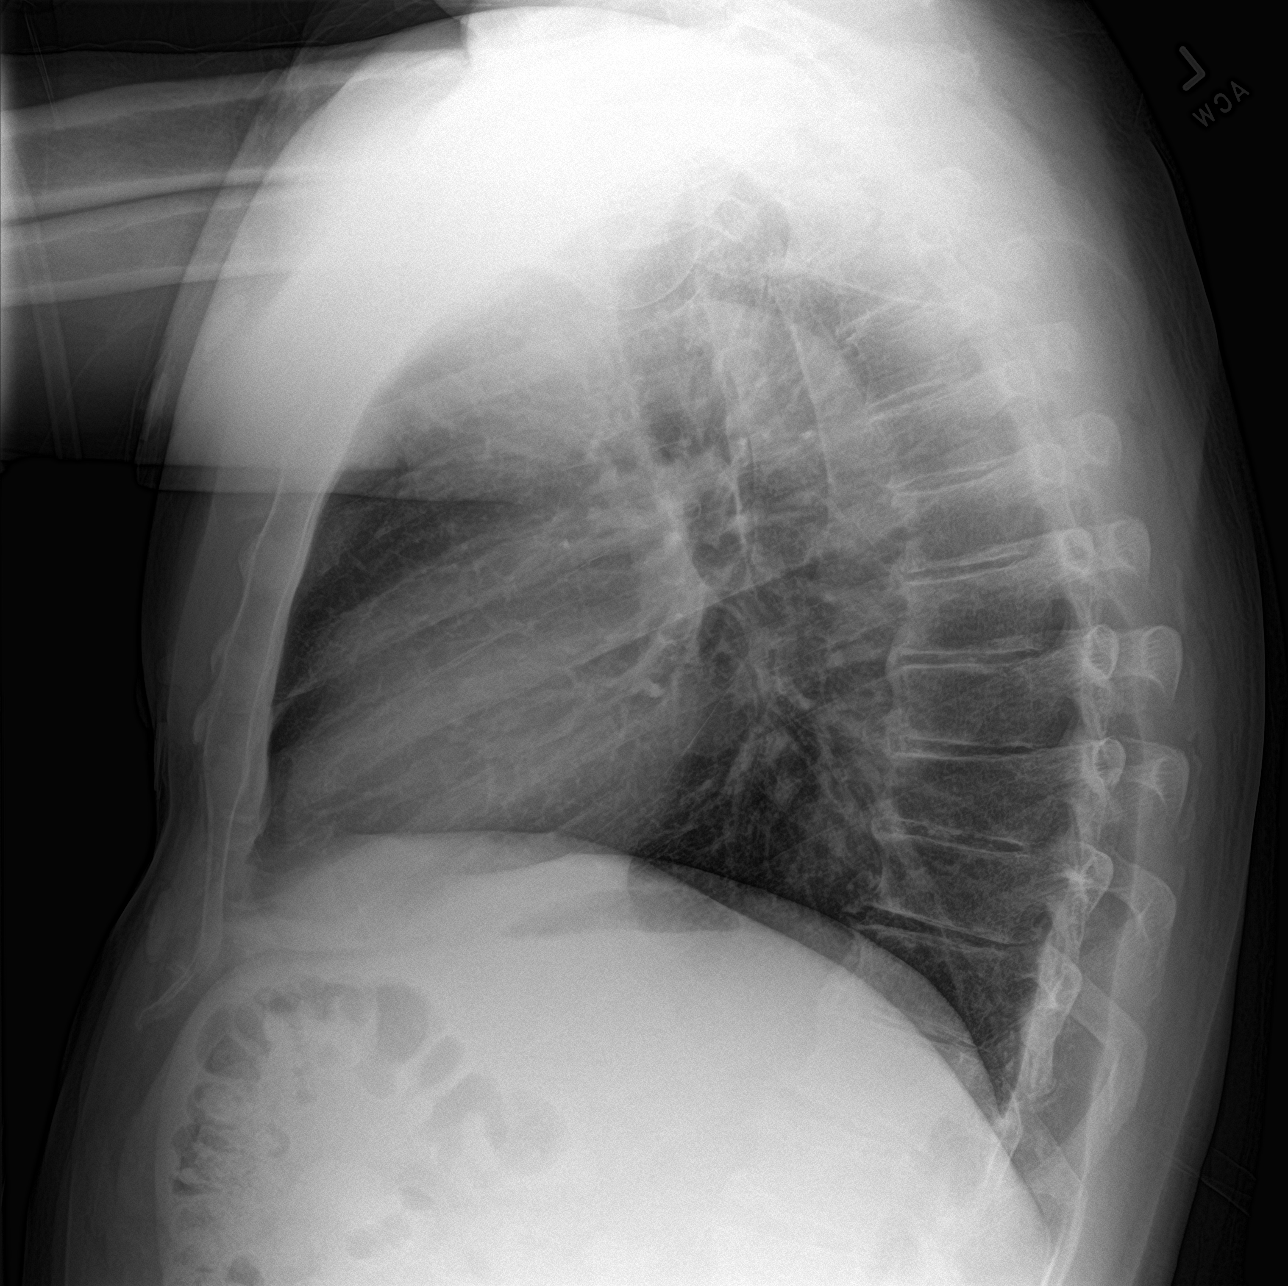

[3 of 3 positions shown; findings below may reference images not displayed]

FINDINGS: The cardiomediastinal contours are normal. The lungs are clear.
Pulmonary vasculature is normal. No consolidation, pleural effusion,
or pneumothorax. No acute osseous abnormalities are seen.
IMPRESSION: No acute chest findings.

## 2024-06-10 ENCOUNTER — Ambulatory Visit: Payer: Self-pay | Admitting: General Surgery

## 2024-07-14 ENCOUNTER — Encounter (HOSPITAL_COMMUNITY): Payer: Self-pay

## 2024-07-14 NOTE — Progress Notes (Signed)
 Surgical Instructions   Your procedure is scheduled on Monday, November 3rd. Report to [facility] Main Entrance A at 10:30 A.M., then check in with the Admitting office. Any questions or running late day of surgery: call [phone]  Questions prior to your surgery date: call [phone], Monday-Friday, 8am-4pm. If you experience any cold or flu symptoms such as cough, fever, chills, shortness of breath, etc. between now and your scheduled surgery, please notify us  at the above number.     Remember:  Do not eat after midnight the night before your surgery   You may drink clear liquids until 9:30 the morning of your surgery.   Clear liquids allowed are: Water, Non-Citrus Juices (without pulp), Carbonated Beverages, Clear Tea (no milk, honey, etc.), Black Coffee Only (NO MILK, CREAM OR POWDERED CREAMER of any kind), and Gatorade.    Take these medicines the morning of surgery with A SIP OF WATER: NONE  One week prior to surgery, STOP taking any Aspirin (unless otherwise instructed by your surgeon) Aleve, Naproxen, Ibuprofen, Motrin, Advil, Goody's, BC's, all herbal medications, fish oil, and non-prescription vitamins.                     Do NOT Smoke (Tobacco/Vaping) for 24 hours prior to your procedure.  If you use a CPAP at night, you may bring your mask/headgear for your overnight stay.   You will be asked to remove any contacts, glasses, piercing's, hearing aid's, dentures/partials prior to surgery. Please bring cases for these items if needed.    Patients discharged the day of surgery will not be allowed to drive home, and someone needs to stay with them for 24 hours.  SURGICAL WAITING ROOM VISITATION Patients may have no more than 2 support people in the waiting area - these visitors may rotate.   Pre-op nurse will coordinate an appropriate time for 1 ADULT support person, who may not rotate, to accompany patient in pre-op.  Children under the age of 68 must have an adult  with them who is not the patient and must remain in the main waiting area with an adult.  If the patient needs to stay at the hospital during part of their recovery, the visitor guidelines for inpatient rooms apply.  Please refer to the [facility] website for the visitor guidelines for any additional information.   If you received a COVID test during your pre-op visit  it is requested that you wear a mask when out in public, stay away from anyone that may not be feeling well and notify your surgeon if you develop symptoms. If you have been in contact with anyone that has tested positive in the last 10 days please notify you surgeon.      Pre-operative CHG Bathing Instructions   You can play a key role in reducing the risk of infection after surgery. Your skin needs to be as free of germs as possible. You can reduce the number of germs on your skin by washing with CHG (chlorhexidine gluconate) soap before surgery. CHG is an antiseptic soap that kills germs and continues to kill germs even after washing.   DO NOT use if you have an allergy to chlorhexidine/CHG or antibacterial soaps. If your skin becomes reddened or irritated, stop using the CHG and notify one of our RNs at [phone].              TAKE A SHOWER THE NIGHT BEFORE SURGERY   Please keep in mind the  following:  You may shave your face before/day of surgery.  Place clean sheets on your bed the night before surgery Use a clean washcloth (not used since being washed) for shower. DO NOT sleep with pet's night before surgery.  CHG Shower Instructions:  Wash your face and private area with normal soap. If you choose to wash your hair, wash first with your normal shampoo.  After you use shampoo/soap, rinse your hair and body thoroughly to remove shampoo/soap residue.  Turn the water OFF and apply half the bottle of CHG soap to a CLEAN washcloth.  Apply CHG soap ONLY FROM YOUR NECK DOWN TO YOUR TOES (washing for 3-5 minutes)  DO  NOT use CHG soap on face, private areas, open wounds, or sores.  Pay special attention to the area where your surgery is being performed.  If you are having back surgery, having someone wash your back for you may be helpful. Wait 2 minutes after CHG soap is applied, then you may rinse off the CHG soap.  Pat dry with a clean towel  Put on clean pajamas    Additional instructions for the day of surgery: If you choose, you may shower the morning of surgery with an antibacterial soap.  DO NOT APPLY any lotions, deodorants, or cologne.   Do not wear jewelry Do not bring valuables to the hospital. [facility] is not responsible for valuables/personal belongings. Put on clean/comfortable clothes.  Please brush your teeth.  Ask your nurse before applying any prescription medications to the skin.

## 2024-07-15 ENCOUNTER — Encounter (HOSPITAL_COMMUNITY)
Admission: RE | Admit: 2024-07-15 | Discharge: 2024-07-15 | Disposition: A | Discharge status: Home / Self Care | Source: Ambulatory Visit | Attending: Family Medicine | Admitting: Family Medicine

## 2024-07-15 ENCOUNTER — Other Ambulatory Visit: Payer: Self-pay

## 2024-07-15 ENCOUNTER — Encounter (HOSPITAL_COMMUNITY): Payer: Self-pay

## 2024-07-15 VITALS — BP 178/97 | HR 65 | Temp 98.5°F | Resp 18 | Ht 72.0 in | Wt 226.5 lb

## 2024-07-15 DIAGNOSIS — Z01818 Encounter for other preprocedural examination: Secondary | ICD-10-CM | POA: Insufficient documentation

## 2024-07-15 HISTORY — DX: Unspecified osteoarthritis, unspecified site: M19.90

## 2024-07-15 HISTORY — DX: Family history of other specified conditions: Z84.89

## 2024-07-15 HISTORY — DX: Personal history of urinary calculi: Z87.442

## 2024-07-15 HISTORY — DX: Gastro-esophageal reflux disease without esophagitis: K21.9

## 2024-07-15 LAB — BASIC METABOLIC PANEL WITH GFR
Anion gap: 9 (ref 5–15)
BUN: 13 mg/dL (ref 8–23)
CO2: 29 mmol/L (ref 22–32)
Calcium: 9.3 mg/dL (ref 8.9–10.3)
Chloride: 102 mmol/L (ref 98–111)
Creatinine, Ser: 0.86 mg/dL (ref 0.61–1.24)
GFR, Estimated: >60 mL/min (ref >60)
Glucose, Bld: 104 mg/dL — ABNORMAL HIGH (ref 70–99)
Potassium: 3.5 mmol/L (ref 3.5–5.1)
Sodium: 140 mmol/L (ref 135–145)

## 2024-07-15 LAB — CBC
HCT: 47.6 % (ref 39.0–52.0)
Hemoglobin: 15.9 g/dL (ref 13.0–17.0)
MCH: 29.4 pg (ref 26.0–34.0)
MCHC: 33.4 g/dL (ref 30.0–36.0)
MCV: 88 fL (ref 80.0–100.0)
Platelets: 320 K/uL (ref 150–400)
RBC: 5.41 MIL/uL (ref 4.22–5.81)
RDW: 13.6 % (ref 11.5–15.5)
WBC: 8.2 K/uL (ref 4.0–10.5)
nRBC: 0 % (ref 0.0–0.2)

## 2024-07-15 NOTE — Progress Notes (Signed)
 PCP - Arnulfo Smith,MD Cardiologist - denies  PPM/ICD - denies Device Orders -  Rep Notified -   Chest x-ray - na EKG - 07/15/24 Stress Test - no ECHO - no Cardiac Cath - none  Sleep Study - none CPAP -   Fasting Blood Sugar - na Checks Blood Sugar _____ times a day  Last dose of GLP1 agonist-  na GLP1 instructions: na  Blood Thinner Instructions:na Aspirin Instructions:na  ERAS Protcol -clear liquids until 0930 PRE-SURGERY Ensure or G2- no  COVID TEST- na   Anesthesia review: yes- abnormal EKG  Patient denies shortness of breath, fever, cough and chest pain at PAT appointment   All instructions explained to the patient, with a verbal understanding of the material. Patient agrees to go over the instructions while at home for a better understanding. . The opportunity to ask questions was provided.

## 2024-07-19 NOTE — Anesthesia Preprocedure Evaluation (Signed)
 Anesthesia Evaluation  Patient identified by MRN, date of birth, ID band Patient awake    Reviewed: Allergy & Precautions, NPO status , Patient's Chart, lab work & pertinent test results  History of Anesthesia Complications Negative for: history of anesthetic complications  Airway Mallampati: III  TM Distance: >3 FB Neck ROM: Full    Dental  (+) Teeth Intact, Dental Advisory Given   Pulmonary former smoker Snores at night, no sleep study    Pulmonary exam normal breath sounds clear to auscultation       Cardiovascular hypertension (175/108 preop, took HCTZ this AM- per pt fluctuates), Pt. on medications Normal cardiovascular exam Rhythm:Regular Rate:Normal     Neuro/Psych negative neurological ROS  negative psych ROS   GI/Hepatic Neg liver ROS,GERD  Controlled,,  Endo/Other  negative endocrine ROS  BMI 31  Renal/GU negative Renal ROS  negative genitourinary   Musculoskeletal  (+) Arthritis , Osteoarthritis,    Abdominal   Peds  Hematology negative hematology ROS (+) Hb 15.9   Anesthesia Other Findings   Reproductive/Obstetrics negative OB ROS                              Anesthesia Physical Anesthesia Plan  ASA: 3  Anesthesia Plan: General   Post-op Pain Management: Tylenol PO (pre-op)*, Toradol IV (intra-op)*, Ketamine IV* and Dilaudid IV   Induction: Intravenous  PONV Risk Score and Plan: 3 and Ondansetron, Dexamethasone, Midazolam and Treatment may vary due to age or medical condition  Airway Management Planned: Oral ETT  Additional Equipment: None  Intra-op Plan:   Post-operative Plan: Extubation in OR  Informed Consent: I have reviewed the patients History and Physical, chart, labs and discussed the procedure including the risks, benefits and alternatives for the proposed anesthesia with the patient or authorized representative who has indicated his/her understanding  and acceptance.     Dental advisory given  Plan Discussed with: CRNA  Anesthesia Plan Comments:          Anesthesia Quick Evaluation

## 2024-07-21 ENCOUNTER — Encounter (HOSPITAL_COMMUNITY)
Admission: RE | Disposition: A | Discharge status: Home / Self Care | Payer: Self-pay | Source: Home / Self Care | Attending: General Surgery

## 2024-07-21 ENCOUNTER — Ambulatory Visit (HOSPITAL_COMMUNITY): Payer: Self-pay | Admitting: Physician Assistant

## 2024-07-21 ENCOUNTER — Ambulatory Visit (HOSPITAL_COMMUNITY)
Admission: RE | Admit: 2024-07-21 | Discharge: 2024-07-21 | Disposition: A | Discharge status: Home / Self Care | Attending: General Surgery | Admitting: General Surgery

## 2024-07-21 ENCOUNTER — Ambulatory Visit (HOSPITAL_COMMUNITY): Payer: Self-pay | Admitting: Certified Registered Nurse Anesthetist

## 2024-07-21 DIAGNOSIS — M1990 Unspecified osteoarthritis, unspecified site: Secondary | ICD-10-CM | POA: Diagnosis not present

## 2024-07-21 DIAGNOSIS — I10 Essential (primary) hypertension: Secondary | ICD-10-CM | POA: Diagnosis not present

## 2024-07-21 DIAGNOSIS — Z87891 Personal history of nicotine dependence: Secondary | ICD-10-CM | POA: Insufficient documentation

## 2024-07-21 DIAGNOSIS — K8012 Calculus of gallbladder with acute and chronic cholecystitis without obstruction: Secondary | ICD-10-CM | POA: Insufficient documentation

## 2024-07-21 DIAGNOSIS — Z79899 Other long term (current) drug therapy: Secondary | ICD-10-CM | POA: Insufficient documentation

## 2024-07-21 DIAGNOSIS — K219 Gastro-esophageal reflux disease without esophagitis: Secondary | ICD-10-CM | POA: Diagnosis not present

## 2024-07-21 DIAGNOSIS — K828 Other specified diseases of gallbladder: Secondary | ICD-10-CM | POA: Diagnosis not present

## 2024-07-21 DIAGNOSIS — R0683 Snoring: Secondary | ICD-10-CM | POA: Insufficient documentation

## 2024-07-21 HISTORY — PX: CHOLECYSTECTOMY: SHX55

## 2024-07-21 SURGERY — LAPAROSCOPIC CHOLECYSTECTOMY WITH INTRAOPERATIVE CHOLANGIOGRAM
Anesthesia: General | Site: Abdomen

## 2024-07-21 MED ORDER — GABAPENTIN 100 MG PO CAPS
100.0000 mg | ORAL_CAPSULE | ORAL | Status: AC
  Administered 2024-07-21: 100 mg via ORAL
  Filled 2024-07-21: qty 1

## 2024-07-21 MED ORDER — MIDAZOLAM HCL 2 MG/2ML IJ SOLN
INTRAMUSCULAR | Status: AC
  Filled 2024-07-21: qty 2

## 2024-07-21 MED ORDER — KETOROLAC TROMETHAMINE 30 MG/ML IJ SOLN
INTRAMUSCULAR | Status: DC | PRN
Start: 2024-07-21 — End: 2024-07-21
  Administered 2024-07-21: 30 mg via INTRAVENOUS

## 2024-07-21 MED ORDER — FENTANYL CITRATE (PF) 250 MCG/5ML IJ SOLN
INTRAMUSCULAR | Status: AC
  Filled 2024-07-21: qty 5

## 2024-07-21 MED ORDER — CHLORHEXIDINE GLUCONATE CLOTH 2 % EX PADS
6.0000 | MEDICATED_PAD | Freq: Once | CUTANEOUS | Status: AC
  Administered 2024-07-21: 6 via TOPICAL

## 2024-07-21 MED ORDER — ORAL CARE MOUTH RINSE: 15.0000 mL | Freq: Once | OROMUCOSAL | Status: AC

## 2024-07-21 MED ORDER — ROCURONIUM BROMIDE 10 MG/ML (PF) SYRINGE
PREFILLED_SYRINGE | INTRAVENOUS | Status: AC
  Filled 2024-07-21: qty 10

## 2024-07-21 MED ORDER — ONDANSETRON HCL 4 MG/2ML IJ SOLN
INTRAMUSCULAR | Status: DC | PRN
Start: 2024-07-21 — End: 2024-07-21
  Administered 2024-07-21: 4 mg via INTRAVENOUS

## 2024-07-21 MED ORDER — LIDOCAINE 2% (20 MG/ML) 5 ML SYRINGE
INTRAMUSCULAR | Status: AC
  Filled 2024-07-21: qty 5

## 2024-07-21 MED ORDER — OXYCODONE HCL 5 MG PO TABS: 5.0000 mg | ORAL_TABLET | Freq: Four times a day (QID) | ORAL | 0 refills | Status: AC | PRN

## 2024-07-21 MED ORDER — FENTANYL CITRATE (PF) 250 MCG/5ML IJ SOLN
INTRAMUSCULAR | Status: DC | PRN
  Administered 2024-07-21 (×3): 50 ug via INTRAVENOUS
  Administered 2024-07-21: 100 ug via INTRAVENOUS

## 2024-07-21 MED ORDER — HYDROMORPHONE HCL 1 MG/ML IJ SOLN: 0.2500 mg | INTRAMUSCULAR | Status: DC | PRN

## 2024-07-21 MED ORDER — LABETALOL HCL 5 MG/ML IV SOLN
INTRAVENOUS | Status: DC | PRN
  Administered 2024-07-21 (×4): 5 mg via INTRAVENOUS

## 2024-07-21 MED ORDER — KETOROLAC TROMETHAMINE 30 MG/ML IJ SOLN
INTRAMUSCULAR | Status: AC
  Filled 2024-07-21: qty 1

## 2024-07-21 MED ORDER — HEMOSTATIC AGENTS (NO CHARGE) OPTIME
TOPICAL | Status: DC | PRN
  Administered 2024-07-21: 1 via TOPICAL

## 2024-07-21 MED ORDER — SUGAMMADEX SODIUM 200 MG/2ML IV SOLN
INTRAVENOUS | Status: DC | PRN
  Administered 2024-07-21: 410 mg via INTRAVENOUS

## 2024-07-21 MED ORDER — SPY AGENT GREEN - (INDOCYANINE FOR INJECTION)
INTRAMUSCULAR | Status: DC | PRN
  Administered 2024-07-21: 2.5 mg via INTRAVENOUS

## 2024-07-21 MED ORDER — 0.9 % SODIUM CHLORIDE (POUR BTL) OPTIME
TOPICAL | Status: DC | PRN
  Administered 2024-07-21: 1000 mL

## 2024-07-21 MED ORDER — OXYCODONE HCL 5 MG PO TABS: 5.0000 mg | ORAL_TABLET | Freq: Once | ORAL | Status: DC | PRN

## 2024-07-21 MED ORDER — LIDOCAINE 2% (20 MG/ML) 5 ML SYRINGE
INTRAMUSCULAR | Status: DC | PRN
  Administered 2024-07-21: 60 mg via INTRAVENOUS

## 2024-07-21 MED ORDER — OXYCODONE HCL 5 MG/5ML PO SOLN: 5.0000 mg | Freq: Once | ORAL | Status: DC | PRN

## 2024-07-21 MED ORDER — ONDANSETRON HCL 4 MG/2ML IJ SOLN: 4.0000 mg | Freq: Once | INTRAMUSCULAR | Status: DC | PRN

## 2024-07-21 MED ORDER — CHLORHEXIDINE GLUCONATE CLOTH 2 % EX PADS: 6.0000 | MEDICATED_PAD | Freq: Once | CUTANEOUS | Status: DC

## 2024-07-21 MED ORDER — PROPOFOL 10 MG/ML IV BOLUS
INTRAVENOUS | Status: DC | PRN
Start: 2024-07-21 — End: 2024-07-21
  Administered 2024-07-21: 200 mg via INTRAVENOUS
  Administered 2024-07-21: 10 mg via INTRAVENOUS

## 2024-07-21 MED ORDER — ROCURONIUM BROMIDE 10 MG/ML (PF) SYRINGE
PREFILLED_SYRINGE | INTRAVENOUS | Status: DC | PRN
Start: 2024-07-21 — End: 2024-07-21
  Administered 2024-07-21 (×3): 10 mg via INTRAVENOUS
  Administered 2024-07-21: 80 mg via INTRAVENOUS

## 2024-07-21 MED ORDER — BUPIVACAINE-EPINEPHRINE (PF) 0.25% -1:200000 IJ SOLN
INTRAMUSCULAR | Status: AC
  Filled 2024-07-21: qty 30

## 2024-07-21 MED ORDER — DEXAMETHASONE SOD PHOSPHATE PF 10 MG/ML IJ SOLN
INTRAMUSCULAR | Status: DC | PRN
Start: 2024-07-21 — End: 2024-07-21
  Administered 2024-07-21: 10 mg via INTRAVENOUS

## 2024-07-21 MED ORDER — MIDAZOLAM HCL (PF) 2 MG/2ML IJ SOLN
INTRAMUSCULAR | Status: DC | PRN
Start: 2024-07-21 — End: 2024-07-21
  Administered 2024-07-21: 2 mg via INTRAVENOUS

## 2024-07-21 MED ORDER — CIPROFLOXACIN IN D5W 400 MG/200ML IV SOLN
400.0000 mg | INTRAVENOUS | Status: AC
  Administered 2024-07-21: 400 mg via INTRAVENOUS
  Filled 2024-07-21: qty 200

## 2024-07-21 MED ORDER — SODIUM CHLORIDE 0.9 % IR SOLN
Status: DC | PRN
  Administered 2024-07-21: 1000 mL

## 2024-07-21 MED ORDER — LACTATED RINGERS IV SOLN: INTRAVENOUS | Status: DC

## 2024-07-21 MED ORDER — HYDRALAZINE HCL 20 MG/ML IJ SOLN
INTRAMUSCULAR | Status: DC | PRN
  Administered 2024-07-21 (×2): 5 mg via INTRAVENOUS
  Administered 2024-07-21: 10 mg via INTRAVENOUS

## 2024-07-21 MED ORDER — HYDRALAZINE HCL 20 MG/ML IJ SOLN
INTRAMUSCULAR | Status: AC
Start: 2024-07-21 — End: 2024-07-21
  Filled 2024-07-21: qty 1

## 2024-07-21 MED ORDER — CHLORHEXIDINE GLUCONATE 0.12 % MT SOLN
15.0000 mL | Freq: Once | OROMUCOSAL | Status: AC
  Administered 2024-07-21: 15 mL via OROMUCOSAL
  Filled 2024-07-21: qty 15

## 2024-07-21 MED ORDER — ACETAMINOPHEN 500 MG PO TABS: 1000.0000 mg | ORAL_TABLET | ORAL | Status: DC

## 2024-07-21 MED ORDER — BUPIVACAINE-EPINEPHRINE 0.25% -1:200000 IJ SOLN
INTRAMUSCULAR | Status: DC | PRN
  Administered 2024-07-21: 18 mL

## 2024-07-21 MED ORDER — KETAMINE HCL 50 MG/5ML IJ SOSY
PREFILLED_SYRINGE | INTRAMUSCULAR | Status: DC | PRN
  Administered 2024-07-21 (×3): 10 mg via INTRAVENOUS

## 2024-07-21 MED ORDER — KETAMINE HCL 50 MG/5ML IJ SOSY
PREFILLED_SYRINGE | INTRAMUSCULAR | Status: AC
  Filled 2024-07-21: qty 5

## 2024-07-21 MED ORDER — ACETAMINOPHEN 500 MG PO TABS
1000.0000 mg | ORAL_TABLET | Freq: Once | ORAL | Status: AC
  Administered 2024-07-21: 1000 mg via ORAL
  Filled 2024-07-21: qty 2

## 2024-07-21 MED ORDER — LABETALOL HCL 5 MG/ML IV SOLN
INTRAVENOUS | Status: AC
  Filled 2024-07-21: qty 4

## 2024-07-21 MED ORDER — ONDANSETRON HCL 4 MG/2ML IJ SOLN
INTRAMUSCULAR | Status: AC
  Filled 2024-07-21: qty 2

## 2024-07-21 MED ORDER — PROPOFOL 10 MG/ML IV BOLUS
INTRAVENOUS | Status: AC
Start: 2024-07-21 — End: 2024-07-21
  Filled 2024-07-21: qty 20

## 2024-07-21 MED ORDER — AMISULPRIDE (ANTIEMETIC) 5 MG/2ML IV SOLN: 10.0000 mg | Freq: Once | INTRAVENOUS | Status: DC | PRN

## 2024-07-21 MED ORDER — KETOROLAC TROMETHAMINE 30 MG/ML IJ SOLN: 30.0000 mg | Freq: Once | INTRAMUSCULAR | Status: DC | PRN

## 2024-07-21 SURGICAL SUPPLY — 35 items
APPLICATOR ARISTA FLEXITIP XL (MISCELLANEOUS) IMPLANT
BAG COUNTER SPONGE SURGICOUNT (BAG) ×1 IMPLANT
BLADE CLIPPER SURG (BLADE) IMPLANT
CANISTER SUCTION 3000ML PPV (SUCTIONS) ×1 IMPLANT
CATH REDDICK CHOLANGI 4FR 50CM (CATHETERS) ×1 IMPLANT
CHLORAPREP W/TINT 26 (MISCELLANEOUS) ×1 IMPLANT
CLIP APPLIE 5 13 M/L LIGAMAX5 (MISCELLANEOUS) ×1 IMPLANT
COVER MAYO STAND STRL (DRAPES) ×1 IMPLANT
COVER SURGICAL LIGHT HANDLE (MISCELLANEOUS) ×1 IMPLANT
DERMABOND ADVANCED .7 DNX12 (GAUZE/BANDAGES/DRESSINGS) ×1 IMPLANT
DRAPE C-ARM 42X120 X-RAY (DRAPES) ×1 IMPLANT
ELECTRODE REM PT RTRN 9FT ADLT (ELECTROSURGICAL) ×1 IMPLANT
GLOVE BIO SURGEON STRL SZ7.5 (GLOVE) ×1 IMPLANT
GOWN STRL REUS W/ TWL LRG LVL3 (GOWN DISPOSABLE) ×3 IMPLANT
HEMOSTAT ARISTA ABSORB 3G PWDR (HEMOSTASIS) IMPLANT
IRRIGATION SUCT STRKRFLW 2 WTP (MISCELLANEOUS) ×1 IMPLANT
IV CATH 14GX2 1/4 (CATHETERS) ×1 IMPLANT
KIT BASIN OR (CUSTOM PROCEDURE TRAY) ×1 IMPLANT
KIT IMAGING PINPOINTPAQ (MISCELLANEOUS) IMPLANT
KIT TURNOVER KIT B (KITS) ×1 IMPLANT
PAD ARMBOARD POSITIONER FOAM (MISCELLANEOUS) ×1 IMPLANT
SCISSORS LAP 5X35 DISP (ENDOMECHANICALS) ×1 IMPLANT
SET TUBE SMOKE EVAC HIGH FLOW (TUBING) ×1 IMPLANT
SLEEVE Z-THREAD 5X100MM (TROCAR) ×2 IMPLANT
SOLN 0.9% NACL POUR BTL 1000ML (IV SOLUTION) ×1 IMPLANT
SOLN STERILE WATER BTL 1000 ML (IV SOLUTION) ×1 IMPLANT
SUT MNCRL AB 4-0 PS2 18 (SUTURE) ×1 IMPLANT
SUT VICRYL 0 UR6 27IN ABS (SUTURE) IMPLANT
SYSTEM BAG RETRIEVAL 10MM (BASKET) ×1 IMPLANT
TOWEL GREEN STERILE (TOWEL DISPOSABLE) ×1 IMPLANT
TOWEL GREEN STERILE FF (TOWEL DISPOSABLE) ×1 IMPLANT
TRAY LAPAROSCOPIC MC (CUSTOM PROCEDURE TRAY) ×1 IMPLANT
TROCAR BALLN 12MMX100 BLUNT (TROCAR) ×1 IMPLANT
TROCAR Z-THREAD OPTICAL 5X100M (TROCAR) ×1 IMPLANT
WARMER LAPAROSCOPE (MISCELLANEOUS) ×1 IMPLANT

## 2024-07-21 NOTE — Anesthesia Procedure Notes (Signed)
 Procedure Name: Intubation Date/Time: 07/21/2024 12:18 PM  Performed by: Harrold Macintosh, CRNAPre-anesthesia Checklist: Patient identified, Emergency Drugs available, Suction available and Patient being monitored Patient Re-evaluated:Patient Re-evaluated prior to induction Oxygen Delivery Method: Circle system utilized Preoxygenation: Pre-oxygenation with 100% oxygen Induction Type: IV induction Ventilation: Mask ventilation without difficulty and Oral airway inserted - appropriate to patient size Laryngoscope Size: Glidescope and 4 Grade View: Grade I Tube type: Oral Tube size: 7.5 mm Number of attempts: 2 Airway Equipment and Method: Stylet, Oral airway, Video-laryngoscopy and Bite block Placement Confirmation: ETT inserted through vocal cords under direct vision, positive ETCO2 and breath sounds checked- equal and bilateral Secured at: 23 cm Tube secured with: Tape Dental Injury: Teeth and Oropharynx as per pre-operative assessment  Difficulty Due To: Difficult Airway- due to reduced neck mobility and Difficult Airway- due to limited oral opening Comments: 1st attempt Grade III view w/Miller 3, limited AOJ extension & small mouth opening

## 2024-07-21 NOTE — Transfer of Care (Signed)
 Immediate Anesthesia Transfer of Care Note  Patient: Eddie Rios  Procedure(s) Performed: LAPAROSCOPIC CHOLECYSTECTOMY WITH ICG (Abdomen)  Patient Location: PACU  Anesthesia Type:General  Level of Consciousness: awake, alert , and oriented  Airway & Oxygen Therapy: Patient Spontanous Breathing and Patient connected to face mask oxygen  Post-op Assessment: Report given to RN, Post -op Vital signs reviewed and stable, Patient moving all extremities X 4, and Patient able to stick tongue midline  Post vital signs: Reviewed and stable  Last Vitals:  Vitals Value Taken Time  BP 127/85 07/21/24 14:01  Temp 36.4 C 07/21/24 14:00  Pulse 72 07/21/24 14:04  Resp 10 07/21/24 14:02  SpO2 93 % 07/21/24 14:04  Vitals shown include unfiled device data.  Last Pain:  Vitals:   07/21/24 1114  PainSc: 0-No pain         Complications: No notable events documented.

## 2024-07-21 NOTE — H&P (Signed)
 REFERRING PHYSICIAN: Dorena Spurr, MD PROVIDER: DEWARD GARNETTE NULL, MD MRN: [id number] DOB: May 01, 1961 Subjective   Chief Complaint: New Consultation  History of Present Illness: Eddie Rios is a 63 y.o. male who is seen today as an office consultation for evaluation of New Consultation  We are asked to see the patient in consultation by Dr. Dorena to evaluate him for gallstones. The patient is a 63 year old white male who has had known gallstones for about 7 years. He initially reports feelings of reflux intermittently over the last 7 years. Over Labor Day he had an episode of severe epigastric abdominal pain with bloating. He had nausea associated with it. He went to his medical doctor and was felt to have a flareup of his gallstones. He has always had very mild elevation of his liver functions. He is otherwise in good health and does not smoke. He does use smokeless tobacco  Review of Systems: A complete review of systems was obtained from the patient. I have reviewed this information and discussed as appropriate with the patient. See HPI as well for other ROS.  ROS   Medical History: Past Medical History:  Diagnosis Date  Arthritis  GERD (gastroesophageal reflux disease)  Hyperlipidemia  Hypertension   Patient Active Problem List  Diagnosis  Calculus of gallbladder without cholecystitis without obstruction   Past Surgical History:  Procedure Laterality Date  JOINT REPLACEMENT    Allergies  Allergen Reactions  Penicillins Hives   Current Outpatient Medications on File Prior to Visit  Medication Sig Dispense Refill  hydroCHLOROthiazide (HYDRODIURIL) 25 MG tablet  rosuvastatin (CRESTOR) 20 MG tablet   No current facility-administered medications on file prior to visit.   Family History  Problem Relation Age of Onset  Heart valve disease Father  Diabetes Father  Hyperlipidemia (Elevated cholesterol) Sister    Social History   Tobacco Use  Smoking Status  Never  Smokeless Tobacco Never    Social History   Socioeconomic History  Marital status: Married  Tobacco Use  Smoking status: Never  Smokeless tobacco: Never  Vaping Use  Vaping status: Never Used  Substance and Sexual Activity  Alcohol  use: Yes  Alcohol /week: 1.0 - 2.0 standard drink of alcohol   Types: 1 - 2 Standard drinks or equivalent per week  Drug use: Never   Objective:   Vitals:  BP: 139/87  Pulse: 103  Temp: 36.7 C (98 F)  SpO2: 99%  Weight: (!) 101.6 kg (224 lb)  Height: 182.9 cm (6')  PainSc: 0-No pain   Body mass index is 30.38 kg/m.  Physical Exam Constitutional:  General: He is not in acute distress. Appearance: Normal appearance.  HENT:  Head: Normocephalic and atraumatic.  Right Ear: External ear normal.  Left Ear: External ear normal.  Nose: Nose normal.  Mouth/Throat:  Mouth: Mucous membranes are moist.  Pharynx: Oropharynx is clear.  Eyes:  General: No scleral icterus. Extraocular Movements: Extraocular movements intact.  Conjunctiva/sclera: Conjunctivae normal.  Pupils: Pupils are equal, round, and reactive to light.  Cardiovascular:  Rate and Rhythm: Normal rate and regular rhythm.  Pulses: Normal pulses.  Heart sounds: Normal heart sounds.  Pulmonary:  Effort: Pulmonary effort is normal. No respiratory distress.  Breath sounds: Normal breath sounds.  Abdominal:  General: Abdomen is flat. Bowel sounds are normal. There is no distension.  Palpations: Abdomen is soft.  Tenderness: There is abdominal tenderness.  Comments: There is mild epigastric tenderness to palpation. There is no palpable mass.  Musculoskeletal:  General: No swelling or deformity.  Normal range of motion.  Cervical back: Normal range of motion and neck supple. No tenderness.  Skin: General: Skin is warm and dry.  Coloration: Skin is not jaundiced.  Neurological:  General: No focal deficit present.  Mental Status: He is alert and oriented to person, place,  and time.  Psychiatric:  Mood and Affect: Mood normal.  Behavior: Behavior normal.     Labs, Imaging and Diagnostic Testing:  Assessment and Plan:   Diagnoses and all orders for this visit:  Calculus of gallbladder without cholecystitis without obstruction - [facility] Case Posting Request; Future   The patient appears to have symptomatic gallstones. Because of the risk of further painful episodes and possible pancreatitis I feel he would benefit from having his gallbladder removed. He would also like to have this done. I have discussed with him in detail the risks and benefits of the operation as well as some of the technical aspects including the risk of common bile duct injury and he understands and wishes to proceed. We will plan for a laparoscopic cholecystectomy with possible intraoperative cholangiogram and move forward with surgical scheduling

## 2024-07-21 NOTE — Anesthesia Procedure Notes (Signed)
 Procedure Name: Intubation Date/Time: 07/21/2024 12:22 PM  Performed by: Harrold Macintosh, CRNAPre-anesthesia Checklist: Patient identified, Emergency Drugs available, Suction available, Patient being monitored and Timeout performed Patient Re-evaluated:Patient Re-evaluated prior to induction Oxygen Delivery Method: Circle system utilized Preoxygenation: Pre-oxygenation with 100% oxygen Induction Type: IV induction Tube size: 7.5 mm Number of attempts: 1 Airway Equipment and Method: Bougie stylet Placement Confirmation: breath sounds checked- equal and bilateral and positive ETCO2 Secured at: 23 cm Tube secured with: Tape Dental Injury: Teeth and Oropharynx as per pre-operative assessment  Comments: Replaced 7.5 ETT for suspected air leak over bougie w/ease.

## 2024-07-21 NOTE — Progress Notes (Signed)
Dr. Doroteo Glassman aware of patient's elevated BP

## 2024-07-21 NOTE — Anesthesia Postprocedure Evaluation (Signed)
 Anesthesia Post Note  Patient: Eddie Rios  Procedure(s) Performed: LAPAROSCOPIC CHOLECYSTECTOMY WITH ICG (Abdomen)     Patient location during evaluation: PACU Anesthesia Type: General Level of consciousness: awake and alert, oriented and patient cooperative Pain management: pain level controlled Vital Signs Assessment: post-procedure vital signs reviewed and stable Respiratory status: spontaneous breathing, nonlabored ventilation and respiratory function stable Cardiovascular status: blood pressure returned to baseline and stable Postop Assessment: no apparent nausea or vomiting Anesthetic complications: no   No notable events documented.  Last Vitals:  Vitals:   07/21/24 1445 07/21/24 1500  BP: 108/77 116/76  Pulse: 72 73  Resp: 18 20  Temp:  36.7 C  SpO2: 97% 96%    Last Pain:  Vitals:   07/21/24 1445  PainSc: 0-No pain                 Almarie CHRISTELLA Marchi

## 2024-07-21 NOTE — Op Note (Signed)
 07/21/2024  1:45 PM  PATIENT:  Eddie Rios  63 y.o. male  PRE-OPERATIVE DIAGNOSIS:  GALLSTONES  POST-OPERATIVE DIAGNOSIS:  GALLSTONES WITH CHOLECYSTITIS  PROCEDURE:  Procedure(s) with comments: LAPAROSCOPIC CHOLECYSTECTOMY WITH ICG (N/A) - LAPAROSCOPIC CHOLECYSTECTOMY  SURGEON:  Surgeons and Role:    * Curvin Deward MOULD, MD - Primary  PHYSICIAN ASSISTANT:   ASSISTANTS: Powell Seats, RNFA   ANESTHESIA:   local and general  EBL:  20cc   BLOOD ADMINISTERED:none  DRAINS: none   LOCAL MEDICATIONS USED:  MARCAINE     SPECIMEN:  Source of Specimen:  gallbladder  DISPOSITION OF SPECIMEN:  PATHOLOGY  COUNTS:  YES  TOURNIQUET:  * No tourniquets in log *  DICTATION: .Dragon Dictation    Procedure: After informed consent was obtained the patient was brought to the operating room and placed in the supine position on the operating room table. After adequate induction of general anesthesia the patient's abdomen was prepped with ChloraPrep allowed to dry and draped in usual sterile manner. An appropriate timeout was performed. The area below the umbilicus was infiltrated with quarter percent  Marcaine. A small incision was made with a 15 blade knife. The incision was carried down through the subcutaneous tissue bluntly with a hemostat and [employer] retractors. The linea alba was identified. The linea alba was incised with a 15 blade knife and each side was grasped with Coker clamps. The preperitoneal space was then probed with a hemostat until the peritoneum was opened and access was gained to the abdominal cavity. A 0 Vicryl pursestring stitch was placed in the fascia surrounding the opening. A Hassan cannula was then placed through the opening and anchored in place with the previously placed Vicryl purse string stitch. The abdomen was insufflated with carbon dioxide without difficulty. A laparoscope was inserted through the [facility] cannula in the right upper quadrant was inspected.  Next the epigastric region was infiltrated with % Marcaine. A small incision was made with a 15 blade knife. A 5 mm port was placed bluntly through this incision into the abdominal cavity under direct vision. Next 2 sites were chosen laterally on the right side of the abdomen for placement of 5 mm ports. Each of these areas was infiltrated with quarter percent Marcaine. Small stab incisions were made with a 15 blade knife. 5 mm ports were then placed bluntly through these incisions into the abdominal cavity under direct vision without difficulty. A blunt grasper was placed through the lateralmost 5 mm port and used to grasp the dome of the gallbladder.  The omentum was densely stuck to the gallbladder.  I was able to bluntly separate the omentum from the top of the gallbladder.  In doing so we found an area that appeared as though a large stone had eroded through the dome of the gallbladder.  This did not appear to be a fistula to the bowel.  We were then able to elevate the gallbladder anteriorly and superiorly.  The rest of the omental adhesions to the body of gallbladder were taken down by blunt dissection with the irrigation tip.  Another blunt grasper was placed through the other 5 mm port and used to retract the body and neck of the gallbladder. A dissector was placed through the epigastric port and using the electrocautery the peritoneal reflection at the gallbladder neck was opened. Blunt dissection was then carried out in this area until the gallbladder neck-cystic duct junction was readily identified and a good critical window was created. 2 clips  were placed proximally on the cystic duct and 1 distally and the duct was divided between the 2 sets of clips. Posterior to this the cystic artery was identified and again dissected bluntly in a circumferential manner until a good window  was created. 2 clips were placed proximally and one distally on the artery and the artery was divided between the 2 sets of  clips. Next a laparoscopic hook cautery device was used to separate the gallbladder from the liver bed. Prior to completely detaching the gallbladder from the liver bed the liver bed was inspected and several small bleeding points were coagulated with the electrocautery until the area was completely hemostatic.  The liver bed was coated with Arista.  The gallbladder was then detached the rest of it from the liver bed without difficulty. A laparoscopic bag was inserted through the hassan port. The laparoscope was moved to the epigastric port. The gallbladder was placed within the bag and the bag was sealed.  The bag with the gallbladder was then removed with the [facility] cannula through the infraumbilical port without difficulty. The fascial defect was then closed with the previously placed Vicryl pursestring stitch as well as with another figure-of-eight 0 Vicryl stitch. The liver bed was inspected again and found to be hemostatic. The abdomen was irrigated with copious amounts of saline until the effluent was clear. The ports were then removed under direct vision without difficulty and were found to be hemostatic. The gas was allowed to escape. No other abnormalities were noted on general inspection of the abdomen. The skin incisions were all closed with interrupted 4-0 Monocryl subcuticular stitches. Dermabond dressings were applied. The patient tolerated the procedure well. At the end of the case all needle sponge and instrument counts were correct. The patient was then awakened and taken to recovery in stable condition  PLAN OF CARE: Discharge to home after PACU  PATIENT DISPOSITION:  PACU - hemodynamically stable.   Delay start of Pharmacological VTE agent (>24hrs) due to surgical blood loss or risk of bleeding: not applicable

## 2024-07-21 NOTE — Interval H&P Note (Signed)
 History and Physical Interval Note:  07/21/2024 11:30 AM  Eddie Rios  has presented today for surgery, with the diagnosis of GALLSTONES.  The various methods of treatment have been discussed with the patient and family. After consideration of risks, benefits and other options for treatment, the patient has consented to  Procedure(s) with comments: LAPAROSCOPIC CHOLECYSTECTOMY WITH INTRAOPERATIVE CHOLANGIOGRAM (N/A) - LAPAROSCOPIC CHOLECYSTECTOMY WITH POSSIBLE IOC as a surgical intervention.  The patient's history has been reviewed, patient examined, no change in status, stable for surgery.  I have reviewed the patient's chart and labs.  Questions were answered to the patient's satisfaction.     Deward Null III

## 2024-07-22 ENCOUNTER — Encounter (HOSPITAL_COMMUNITY): Payer: Self-pay | Admitting: General Surgery

## 2024-07-22 LAB — SURGICAL PATHOLOGY
